# Patient Record
Sex: Female | Born: 1935 | Race: White | Hispanic: No | Smoking: Former smoker
Health system: Southern US, Community
[De-identification: ages and names within clinical notes are randomized; demographics above are authoritative.]

## PROBLEM LIST (undated history)

## (undated) DIAGNOSIS — I4891 Unspecified atrial fibrillation: Secondary | ICD-10-CM

## (undated) DIAGNOSIS — K219 Gastro-esophageal reflux disease without esophagitis: Secondary | ICD-10-CM

## (undated) DIAGNOSIS — N83202 Unspecified ovarian cyst, left side: Secondary | ICD-10-CM

## (undated) DIAGNOSIS — N83201 Unspecified ovarian cyst, right side: Secondary | ICD-10-CM

## (undated) DIAGNOSIS — F419 Anxiety disorder, unspecified: Secondary | ICD-10-CM

## (undated) DIAGNOSIS — R413 Other amnesia: Secondary | ICD-10-CM

## (undated) DIAGNOSIS — I10 Essential (primary) hypertension: Secondary | ICD-10-CM

## (undated) HISTORY — DX: Gastro-esophageal reflux disease without esophagitis: K21.9

## (undated) HISTORY — PX: ESOPHAGOGASTRODUODENOSCOPY: SHX1529

## (undated) HISTORY — DX: Unspecified atrial fibrillation: I48.91

## (undated) HISTORY — PX: OVARIAN CYST SURGERY: SHX726

## (undated) HISTORY — DX: Anxiety disorder, unspecified: F41.9

## (undated) HISTORY — PX: COLONOSCOPY: SHX5424

## (undated) HISTORY — PX: KNEE SURGERY: SHX244

## (undated) HISTORY — PX: EYE SURGERY: SHX253

---

## 1997-10-04 ENCOUNTER — Emergency Department (HOSPITAL_COMMUNITY): Admission: EM | Admit: 1997-10-04 | Discharge: 1997-10-04 | Payer: Self-pay | Admitting: Emergency Medicine

## 1998-01-14 ENCOUNTER — Ambulatory Visit (HOSPITAL_COMMUNITY): Admission: RE | Admit: 1998-01-14 | Discharge: 1998-01-14 | Payer: Self-pay | Admitting: *Deleted

## 1998-04-15 ENCOUNTER — Emergency Department (HOSPITAL_COMMUNITY): Admission: EM | Admit: 1998-04-15 | Discharge: 1998-04-15 | Payer: Self-pay | Admitting: Emergency Medicine

## 1998-08-15 ENCOUNTER — Ambulatory Visit (HOSPITAL_COMMUNITY): Admission: RE | Admit: 1998-08-15 | Discharge: 1998-08-15 | Payer: Self-pay | Admitting: *Deleted

## 1999-01-10 ENCOUNTER — Ambulatory Visit (HOSPITAL_COMMUNITY): Admission: RE | Admit: 1999-01-10 | Discharge: 1999-01-10 | Payer: Self-pay | Admitting: *Deleted

## 1999-01-10 ENCOUNTER — Encounter (INDEPENDENT_AMBULATORY_CARE_PROVIDER_SITE_OTHER): Payer: Self-pay | Admitting: Specialist

## 1999-09-09 ENCOUNTER — Other Ambulatory Visit: Admission: RE | Admit: 1999-09-09 | Discharge: 1999-09-09 | Payer: Self-pay | Admitting: *Deleted

## 2000-05-07 ENCOUNTER — Ambulatory Visit (HOSPITAL_COMMUNITY): Admission: RE | Admit: 2000-05-07 | Discharge: 2000-05-07 | Payer: Self-pay | Admitting: *Deleted

## 2000-12-10 ENCOUNTER — Ambulatory Visit (HOSPITAL_COMMUNITY): Admission: RE | Admit: 2000-12-10 | Discharge: 2000-12-10 | Payer: Self-pay | Admitting: *Deleted

## 2001-05-31 ENCOUNTER — Encounter: Admission: RE | Admit: 2001-05-31 | Discharge: 2001-05-31 | Payer: Self-pay | Admitting: *Deleted

## 2001-06-24 ENCOUNTER — Encounter (INDEPENDENT_AMBULATORY_CARE_PROVIDER_SITE_OTHER): Payer: Self-pay | Admitting: Specialist

## 2001-06-24 ENCOUNTER — Ambulatory Visit (HOSPITAL_COMMUNITY): Admission: RE | Admit: 2001-06-24 | Discharge: 2001-06-24 | Payer: Self-pay | Admitting: *Deleted

## 2001-09-25 ENCOUNTER — Emergency Department (HOSPITAL_COMMUNITY): Admission: EM | Admit: 2001-09-25 | Discharge: 2001-09-25 | Payer: Self-pay | Admitting: Emergency Medicine

## 2002-08-11 ENCOUNTER — Encounter: Admission: RE | Admit: 2002-08-11 | Discharge: 2002-08-11 | Payer: Self-pay | Admitting: Internal Medicine

## 2002-08-11 ENCOUNTER — Encounter: Payer: Self-pay | Admitting: Internal Medicine

## 2003-04-25 ENCOUNTER — Emergency Department (HOSPITAL_COMMUNITY): Admission: EM | Admit: 2003-04-25 | Discharge: 2003-04-25 | Payer: Self-pay | Admitting: *Deleted

## 2003-08-03 ENCOUNTER — Ambulatory Visit (HOSPITAL_COMMUNITY): Admission: RE | Admit: 2003-08-03 | Discharge: 2003-08-03 | Payer: Self-pay | Admitting: *Deleted

## 2003-08-03 ENCOUNTER — Encounter (INDEPENDENT_AMBULATORY_CARE_PROVIDER_SITE_OTHER): Payer: Self-pay | Admitting: *Deleted

## 2004-01-25 ENCOUNTER — Encounter: Admission: RE | Admit: 2004-01-25 | Discharge: 2004-01-25 | Payer: Self-pay | Admitting: Internal Medicine

## 2004-02-08 ENCOUNTER — Ambulatory Visit (HOSPITAL_COMMUNITY): Admission: RE | Admit: 2004-02-08 | Discharge: 2004-02-08 | Payer: Self-pay | Admitting: Internal Medicine

## 2005-03-20 ENCOUNTER — Other Ambulatory Visit: Admission: RE | Admit: 2005-03-20 | Discharge: 2005-03-20 | Payer: Self-pay | Admitting: Internal Medicine

## 2005-11-17 ENCOUNTER — Encounter: Admission: RE | Admit: 2005-11-17 | Discharge: 2005-11-17 | Payer: Self-pay | Admitting: Internal Medicine

## 2005-12-17 ENCOUNTER — Emergency Department (HOSPITAL_COMMUNITY): Admission: EM | Admit: 2005-12-17 | Discharge: 2005-12-18 | Payer: Self-pay | Admitting: Emergency Medicine

## 2006-09-24 ENCOUNTER — Encounter (INDEPENDENT_AMBULATORY_CARE_PROVIDER_SITE_OTHER): Payer: Self-pay | Admitting: *Deleted

## 2006-09-24 ENCOUNTER — Ambulatory Visit (HOSPITAL_COMMUNITY): Admission: RE | Admit: 2006-09-24 | Discharge: 2006-09-24 | Payer: Self-pay | Admitting: *Deleted

## 2007-10-25 ENCOUNTER — Encounter: Admission: RE | Admit: 2007-10-25 | Discharge: 2007-10-25 | Payer: Self-pay | Admitting: Internal Medicine

## 2007-12-10 ENCOUNTER — Emergency Department (HOSPITAL_COMMUNITY): Admission: EM | Admit: 2007-12-10 | Discharge: 2007-12-10 | Payer: Self-pay | Admitting: Emergency Medicine

## 2008-04-23 ENCOUNTER — Inpatient Hospital Stay (HOSPITAL_COMMUNITY): Admission: RE | Admit: 2008-04-23 | Discharge: 2008-04-26 | Payer: Self-pay | Admitting: Orthopedic Surgery

## 2008-12-13 ENCOUNTER — Encounter: Admission: RE | Admit: 2008-12-13 | Discharge: 2008-12-13 | Payer: Self-pay | Admitting: Internal Medicine

## 2009-02-02 ENCOUNTER — Emergency Department (HOSPITAL_COMMUNITY): Admission: EM | Admit: 2009-02-02 | Discharge: 2009-02-02 | Payer: Self-pay | Admitting: Family Medicine

## 2009-05-20 ENCOUNTER — Encounter: Admission: RE | Admit: 2009-05-20 | Discharge: 2009-05-20 | Payer: Self-pay | Admitting: Internal Medicine

## 2010-06-18 ENCOUNTER — Other Ambulatory Visit: Payer: Self-pay | Admitting: Internal Medicine

## 2010-06-18 ENCOUNTER — Ambulatory Visit
Admission: RE | Admit: 2010-06-18 | Discharge: 2010-06-18 | Disposition: A | Discharge status: Home / Self Care | Payer: Medicare Other | Source: Ambulatory Visit | Attending: Internal Medicine | Admitting: Internal Medicine

## 2010-06-18 DIAGNOSIS — R109 Unspecified abdominal pain: Secondary | ICD-10-CM

## 2010-07-30 ENCOUNTER — Other Ambulatory Visit: Payer: Self-pay | Admitting: Family Medicine

## 2010-07-30 DIAGNOSIS — R1011 Right upper quadrant pain: Secondary | ICD-10-CM

## 2010-07-30 DIAGNOSIS — M549 Dorsalgia, unspecified: Secondary | ICD-10-CM

## 2010-07-31 ENCOUNTER — Other Ambulatory Visit: Payer: Medicare Other

## 2010-08-01 ENCOUNTER — Other Ambulatory Visit: Payer: Self-pay | Admitting: Family Medicine

## 2010-08-01 ENCOUNTER — Ambulatory Visit
Admission: RE | Admit: 2010-08-01 | Discharge: 2010-08-01 | Disposition: A | Discharge status: Home / Self Care | Payer: Medicare Other | Source: Ambulatory Visit | Attending: Family Medicine | Admitting: Family Medicine

## 2010-08-01 DIAGNOSIS — M549 Dorsalgia, unspecified: Secondary | ICD-10-CM

## 2010-08-01 DIAGNOSIS — R1011 Right upper quadrant pain: Secondary | ICD-10-CM

## 2010-08-04 LAB — URINE CULTURE
Colony Count: NO GROWTH
Culture: NO GROWTH
Special Requests: POSITIVE

## 2010-08-04 LAB — CBC
Hemoglobin: 9.3 g/dL — ABNORMAL LOW (ref 12.0–15.0)
MCHC: 35.6 g/dL (ref 30.0–36.0)
MCV: 96.3 fL (ref 78.0–100.0)
Platelets: 153 10*3/uL (ref 150–400)
RBC: 2.8 MIL/uL — ABNORMAL LOW (ref 3.87–5.11)
RBC: 2.83 MIL/uL — ABNORMAL LOW (ref 3.87–5.11)
RBC: 3.51 MIL/uL — ABNORMAL LOW (ref 3.87–5.11)
RDW: 12.2 % (ref 11.5–15.5)
WBC: 9.5 10*3/uL (ref 4.0–10.5)

## 2010-08-04 LAB — BASIC METABOLIC PANEL
BUN: 21 mg/dL (ref 6–23)
BUN: 25 mg/dL — ABNORMAL HIGH (ref 6–23)
CO2: 24 mEq/L (ref 19–32)
CO2: 25 mEq/L (ref 19–32)
Calcium: 7.1 mg/dL — ABNORMAL LOW (ref 8.4–10.5)
Calcium: 7.3 mg/dL — ABNORMAL LOW (ref 8.4–10.5)
Calcium: 7.3 mg/dL — ABNORMAL LOW (ref 8.4–10.5)
Calcium: 7.7 mg/dL — ABNORMAL LOW (ref 8.4–10.5)
Calcium: 8.1 mg/dL — ABNORMAL LOW (ref 8.4–10.5)
Chloride: 96 mEq/L (ref 96–112)
Creatinine, Ser: 1.34 mg/dL — ABNORMAL HIGH (ref 0.4–1.2)
Creatinine, Ser: 1.6 mg/dL — ABNORMAL HIGH (ref 0.4–1.2)
Creatinine, Ser: 1.71 mg/dL — ABNORMAL HIGH (ref 0.4–1.2)
GFR calc Af Amer: 25 mL/min — ABNORMAL LOW (ref >60)
GFR calc Af Amer: 26 mL/min — ABNORMAL LOW (ref >60)
GFR calc Af Amer: 35 mL/min — ABNORMAL LOW (ref >60)
GFR calc Af Amer: 38 mL/min — ABNORMAL LOW (ref >60)
GFR calc Af Amer: 46 mL/min — ABNORMAL LOW (ref >60)
GFR calc non Af Amer: 20 mL/min — ABNORMAL LOW (ref >60)
GFR calc non Af Amer: 22 mL/min — ABNORMAL LOW (ref >60)
GFR calc non Af Amer: 31 mL/min — ABNORMAL LOW (ref >60)
Sodium: 132 mEq/L — ABNORMAL LOW (ref 135–145)
Sodium: 136 mEq/L (ref 135–145)

## 2010-08-04 LAB — PROTIME-INR
INR: 1.2 (ref 0.00–1.49)
INR: 1.8 — ABNORMAL HIGH (ref 0.00–1.49)
INR: 2.8 — ABNORMAL HIGH (ref 0.00–1.49)
Prothrombin Time: 15.5 seconds — ABNORMAL HIGH (ref 11.6–15.2)
Prothrombin Time: 21.9 seconds — ABNORMAL HIGH (ref 11.6–15.2)
Prothrombin Time: 31.6 seconds — ABNORMAL HIGH (ref 11.6–15.2)

## 2010-08-04 LAB — GLUCOSE, CAPILLARY
Glucose-Capillary: 122 mg/dL — ABNORMAL HIGH (ref 70–99)
Glucose-Capillary: 127 mg/dL — ABNORMAL HIGH (ref 70–99)
Glucose-Capillary: 137 mg/dL — ABNORMAL HIGH (ref 70–99)

## 2010-08-04 LAB — URINALYSIS, ROUTINE W REFLEX MICROSCOPIC
Bilirubin Urine: NEGATIVE
Glucose, UA: NEGATIVE mg/dL
Ketones, ur: NEGATIVE mg/dL
Leukocytes, UA: NEGATIVE
Protein, ur: NEGATIVE mg/dL

## 2010-08-04 LAB — TYPE AND SCREEN

## 2010-08-04 LAB — ABO/RH: ABO/RH(D): A POS

## 2010-08-04 LAB — URINE MICROSCOPIC-ADD ON

## 2010-09-02 NOTE — Op Note (Signed)
NAMEPELLA, ORIGER                 ACCOUNT NO.:  [id number]   MEDICAL RECORD NO.:  [id number]          PATIENT TYPE:  AMB   LOCATION:  ENDO                         FACILITY:  [facility]   PHYSICIAN:  Waverly Ferrari, M.D.    DATE OF BIRTH:  1928/09/28   DATE OF PROCEDURE:  09/24/2006  DATE OF DISCHARGE:                               OPERATIVE REPORT   PROCEDURE:  Upper endoscopy.   INDICATIONS:  GERD.   ANESTHESIA:  Demerol 80 and Versed 8 mg.   PROCEDURE:  With the patient mildly sedated in the left lateral  decubitus position, the Pentax videoscopic endoscope was inserted in the  mouth, passed under direct vision through the esophagus when we saw  changes of Barrett's esophagus.  There was a polyp at the proximal end  of the gastric fold near the squamocolumnar junction.  This was  photographed and multiple biopsies taken of this and the surrounding  areas of Barrett's esophagus.  We entered into the stomach.  Fundus,  body and antrum were visualized.  In the body of the stomach were a  number of polyps.  They were also in the fundus.  They were photographed  and multiple biopsies taken.  Duodenal bulb and second portion of the  duodenum appeared normal.  From this point the endoscope was slowly  withdrawn taking circumferential views of duodenal mucosa until the  endoscope had been pulled back into the stomach, placed in retroflexion  to view the stomach from below.  The endoscope was then straightened and  withdrawn taking circumferential views of remaining gastric and  esophageal mucosa.  The patient's vital signs and pulse oximeter  remained stable.  The patient tolerated the procedure well without  apparent complications.   FINDINGS:  Changes of Barrett's esophagus with polyp of the proximal  stomach biopsied.  Polyps of the stomach as well.  Await biopsy report.  The patient will call me for results and follow-up with me as an  outpatient.  Proceed to colonoscopy as  planned.           ______________________________  Waverly Ferrari, M.D.     GMO/MEDQ  D:  09/24/2006  T:  09/24/2006  Job:  [id number]

## 2010-09-02 NOTE — Op Note (Signed)
Christine Conley, Christine Conley                 ACCOUNT NO.:  [id number]   MEDICAL RECORD NO.:  [id number]          PATIENT TYPE:  INP   LOCATION:  1620                         FACILITY:  [facility]   PHYSICIAN:  Gaynelle Arabian, M.D.    DATE OF BIRTH:  [birth year]   DATE OF PROCEDURE:  04/23/2008  DATE OF DISCHARGE:                               OPERATIVE REPORT   PREOPERATIVE DIAGNOSIS:  Osteoarthritis left knee.   POSTOPERATIVE DIAGNOSIS:  Osteoarthritis left knee.   PROCEDURE:  Left total knee arthroplasty.   SURGEON:  Gaynelle Arabian, M.D.   ASSISTANT:  Evert Kohl, P.A.-C.   ANESTHESIA:  Spinal.   ESTIMATED BLOOD LOSS:  Minimal.   DRAINS:  None.   TOURNIQUET TIME:  36 minutes at 300 mmHg.   COMPLICATIONS:  None.   CONDITION:  Stable to recovery room.   BRIEF CLINICAL NOTE:  Christine Conley is a 77 year old female with end-stage  arthritis both knees, left more symptomatic than the right.  She has  failed nonoperative management and presents now for total knee  arthroplasty.   PROCEDURE IN DETAIL:  After successful administration of spinal  anesthetic, a tourniquet is placed high on her left thigh and left lower  extremity prepped and draped in the usual sterile fashion.  Extremity  was wrapped in Esmarch, knee flexed, tourniquet inflated to 300 mmHg.  Midline incision was made with a 10 blade through subcutaneous tissue to  the level of the extensor mechanism.  A fresh blade was used to make a  medial parapatellar arthrotomy.  Soft tissue over the proximal medial  tibia subperiosteally elevated to the joint line with the knife and into  the semimembranosus bursa with a Cobb elevator.  Soft tissue laterally  is elevated with attention being paid to avoid patellar tendon on tibial  tubercle.  The patella is subluxed laterally, knee flexed 90 degrees and  ACL and PCL removed.  Drill was used to create a starting hole in the  distal femur.  The canal was thoroughly irrigated.  The 5  degrees left  valgus alignment guide is placed and referencing off posterior condyles,  rotations marked and a block pinned to remove 11 mm off the distal  femur.  11 mm were resected due to flexion contracture.  Distal femoral  resection is made with an oscillating saw.  The sizing blocks placed,  size 3 is most appropriate.  Rotations marked off the epicondylar axis.  Size 3 cutting block is placed and the anterior, posterior chamfer cuts  were made.   The tibia subluxed forward and the menisci are removed.  The  extramedullary tibial alignment guide is placed referencing proximally  off the medial aspect of the tibial tubercle and distally along the  second metatarsal axis and tibial crest.  Blocks pinned to remove about  10 mm off the non deficient lateral side.  Tibial resection is made with  an oscillating saw.  Size 3 is the most appropriate tibial component and  the proximal tibia is prepared with a modular drill and keel punch for  the size 3.  Femoral preparation is completed with the intercondylar  cut.   Size 3 mobile bearing tibial trial, size 3 posterior stabilized femoral  trial and a 10 mm posterior stabilized rotating platform insert trial  are placed.  With a 10, full extension was achieved with excellent varus-  valgus, anterior-posterior balance throughout full range of motion.  Patella was then everted and thickness measured to be 23 mm.  Freehand  resection taken to 13 mm, 35 template is placed, lug holes were drilled,  trial patella was placed and it tracks normally.  Osteophytes removed  off the posterior femur with the trial in place.  All trials removed and  the cut bone surfaces are prepared with pulsatile lavage.  Cement was  mixed and once ready for implantation, a size 3 mobile bearing tibial  tray, size 3 posterior stabilized femur and 35 patella are cemented into  place.  The patella was held with a clamp.  Trial 10-mm insert is  placed, knee held in  full extension and all extruded cement removed.  Once cement fully hardened then the permanent 10 mm posterior stabilized  rotating platform insert is placed into the tibial tray.  Wounds  copiously irrigated with saline solution and the FloSeal injected on the  posterior capsule, medial and lateral gutters and suprapatellar area.  Moist sponge is placed and the tourniquet released for a total  tourniquet time of 36 minutes.  The sponge is held for 2 minutes, then  removed.  Minimal bleeding was encountered.  Bleeding that is  encountered is stopped with electrocautery.  The wound was again  irrigated and the arthrotomy closed with interrupted #1 PDS.  Flexion  against gravity about 135 degrees.  Subcu was closed with interrupted 2-  0 Vicryl and subcuticular closed with running 4-0 Monocryl.  The  incision is then cleaned and dried and Steri-Strips and a bulky sterile  dressing are applied.  She is then placed into a knee immobilizer,  awakened and transferred to recovery in stable condition.      Gaynelle Arabian, M.D.  Electronically Signed     FA/MEDQ  D:  04/23/2008  T:  04/23/2008  Job:  [id number]

## 2010-09-02 NOTE — Op Note (Signed)
[name], Christine Conley                 ACCOUNT NO.:  [id number]   MEDICAL RECORD NO.:  [id number]          PATIENT TYPE:  AMB   LOCATION:  ENDO                         FACILITY:  [facility]   PHYSICIAN:  Waverly Ferrari, M.D.    DATE OF BIRTH:  12-Dec-1928   DATE OF PROCEDURE:  09/24/2006  DATE OF DISCHARGE:                               OPERATIVE REPORT   PROCEDURE:  Colonoscopy.   INDICATIONS:  Colon polyps, colon cancer screening.   ANESTHESIA:  Demerol 40 mg, Versed 4 mg.   PROCEDURE:  With the patient mildly sedated in the left lateral  decubitus position the Pentax videoscopic colonoscope was inserted the  rectum, passed through a diverticular filled colon to reach the cecum as  identified by ileocecal valve and appendiceal orifice both which were  photographed.  From this point the colonoscope was slowly withdrawn  taking circumferential views of colonic mucosa stopping in the  descending colon were two polyps were seen, one was photographed but  both were removed using hot biopsy forceps technique setting of 20/200  blended current.  We withdrew all the way to the rectum which appeared  normal on direct and showed hemorrhoids on retroflexed view.  The  endoscope was straightened, withdrawn.  The patient's vital signs, pulse  oximeter remained stable.  The patient tolerated procedure well without  apparent complications.   FINDINGS:  Diffuse diverticulosis of the colon, polyps of descending  colon.  Internal hemorrhoids.   PLAN:  Await biopsy report.  The patient will call me for results and  follow-up with me as an outpatient.           ______________________________  Waverly Ferrari, M.D.     GMO/MEDQ  D:  09/24/2006  T:  09/24/2006  Job:  [id number]

## 2010-09-02 NOTE — Discharge Summary (Signed)
NAMECHARLIANN, Christine Conley                 ACCOUNT NO.:  [id number]   MEDICAL RECORD NO.:  [id number]          PATIENT TYPE:  INP   LOCATION:  1620                         FACILITY:  [facility]   PHYSICIAN:  Gaynelle Arabian, M.D.    DATE OF BIRTH:  09-Apr-1929   DATE OF ADMISSION:  04/23/2008  DATE OF DISCHARGE:  04/26/2008                               DISCHARGE SUMMARY   ADMITTING DIAGNOSES:  1. End-stage rheumatoid arthritis, bilateral knees.  2. Hypertension.  3. Diet-controlled diabetes.  4. Gastroesophageal reflux disease.  5. History of Barrett's esophagus.  6. Sleep apnea.  7. History of irritable bowel syndrome.  8. Anxiety.  9. Cataracts.  10.History of shingles.  11.History of gallstones.  12.History of diverticulosis.  13.Osteopenia.   DISCHARGE DIAGNOSES:  1. Osteoarthritis, left knee status post left total knee replacement      arthroplasty.  2. Osteoarthritis, right knee.  3. Postop renal insufficiency, improving.  4. Postop hyponatremia, improved.  5. Mild postop acute blood loss anemia.  Did not require transfusion.  6. End-stage rheumatoid arthritis, bilateral knees.  7. Hypertension.  8. Diet-controlled diabetes.  9. Gastroesophageal reflux disease.  10.History of Barrett's esophagus.  11.Sleep apnea.  12.History of irritable bowel syndrome.  13.Anxiety.  14.Cataracts.  15.History of shingles.  16.History of gallstones.  17.History of diverticulosis.  18.Osteopenia.   PROCEDURE:  April 23, 2008, left total knee surgery.   SURGEON:  Gaynelle Arabian, M.D.   ASSISTANT:  Rutherford Limerick PA-C.   ANESTHESIA:  Spinal.   TOURNIQUET TIME:  36 minutes.   CONSULTS:  None.   BRIEF HISTORY:  Ms. Mozo is a 77 year old female known by Dr. Wynelle Link  for bilateral knee arthritis, progressively getting worse, bone on bone,  left is worse than the right.  She has failed conservative treatment and  now presents for total knee arthroplasty.   LABORATORY DATA:  Preop CBC  showed hemoglobin of 14.2, hematocrit of  41.3, white cell count 6.1, platelets 217.  PT/PTT 14 and 29,  respectively.  INR 1.1.  Chem panel on admission slightly showed  elevated glucose of 126, known diet controlled diabetic.  Remaining Chem  panel within normal limits.  Preop UA showed trace blood, 0-2 whites, 0-  2 reds, otherwise negative.  Serial CBCs were followed throughout the  hospital course.  Hemoglobin did drop down to 11.5, then 9.3, came back  up a little bit and stabilized at  9.7 and 27.3.  Serial protimes  followed per Coumadin protocol.  Last known PT/INR 31.6 and 2.8.  Serial  BMETs were followed.  Sodium did drop from 143 to 132, got as low as  131, came back up to 137.  Her starting BUN and creatinine was normal at  16 and 0.9, however, she did develop some mild renal insufficiency and  her BUN went up to 26, but back down to 21.  The creatinine went from  0.9 to 1.6, got as high as 2.3, drifted back down to 2.2, then 1.7 and  was improving and last noted was 1.3.  Follow-up UA on April 26, 2008,  small blood, 0-2 whites, 0-2 reds, otherwise negative.  The culture was  pending at that time of dictation.   X-RAYS:  Two-view chest April 16, 2008, no active lung disease,  probable small hiatal hernia.   HOSPITAL COURSE:  The patient admitted to [facility],  tolerated total protein well, later transferred to the recovery room and  then orthopedic floor, started on PCA and p.o. analgesic pain control  following surgery.  Wanted to go to a skilled nursing facility postop so  we got social worker involved immediately.  Started back on her home  medications.  Pressure was a little soft and systolic was only about  [id number].  She was on an ACE inhibitor and she was under spinal so the urine  output was a little low.  We held her blood pressure medications,  especially the ACE inhibitor.  We did give her fluids and her output was  low to begin with and only slowly  improved.  Hemoglobin was stable  though her creatinine had bumped just a little bit on day #1 up to 1.6,  associated probably with the mild soft pressures and the spinal, also  being on an ACE inhibitor preoperatively.  By day #2, her output had  started to slowly increase but the creatinine had bumped up to 2.3.  We  did recheck and it was stable at 2.2.  She had continued with fluids but  by the afternoon of day #2, her output had increased and she was doing  much better with that.  Her creatinine started to improve and it got  down to 1.7; although with all the fluids, she did get some dilutional  component her sodium was down to 131.  Once her output was good and her  pressure was improving we stopped the fluids.  The renal insufficiency  continued to improve and by the following day, postop day #3, it was  down to 1.3.  Her sodium was back up to 137.  From a therapy standpoint,  she got out of bed on day #1 and was walking in the hallways a short  distance.  Dressing was changed on day #2 and incision was healing well.  By day #3, she was progressing with her therapy.  Medically, she was  stable.  Pressure was stable in the [id number] systolic.  Her sodium had  corrected itself to normal level and the creatinine was improving.  Discharge planning had been working with her and they found a bed over  at [facility].  Since she was stable we decided to transfer her over  at that time.   DISCHARGE/PLAN:  1. The patient transferred over to [facility] on April 26, 2008.  2. Discharge diagnoses please see above.   DISCHARGE MEDICATIONS:  Current medications at time of transfer include  1. Atenolol 25 mg daily p.m.  2. Enalapril/hydrochlorothiazide 10/25 p.o. q.p.m., may resume this      tomorrow on April 27, 2008.  3. Coumadin protocol.  Please titrate the Coumadin level for target      INR between 2 and 3, she needs to be on Coumadin for 3 weeks of      date of surgery of April 23, 2008, please see the bottom of the      summary for the Coumadin regimen during the hospital course.  4. Colace 100 mg p.o. b.i.d.  5. Nexium 40 mg p.o. q.h.s.  6. Nu-Iron 150 mg p.o. daily per 3 weeks, may discontinue  3 weeks from      the date of surgery.  7. Tylenol 325 one or two every 4-6 hours as needed for mild pain,      temperature or headache,  8. Robaxin 500 mg p.o. q.6-8 h p.r.n. spasm.  9. Percocet 5 mg 1-2 every 4 hours as needed for pain.  10.Valium 5 mg p.o. q.12 h as needed for spasm or agitation.  11.Ultram 50 mg p.o. q.4 h p.r.n. mild pain.   DIET:  Low-sodium, heart-healthy diet, low-cholesterol diet.   ACTIVITY:  She is weightbearing as tolerated to the left lower  extremity.  Continue gait training, ambulation, ADLs, PT and OT for  total knee protocol.  She may start showering, however, do not submerge  the incision under water.   FOLLOW UP:  She needs to follow up with Dr. Wynelle Link in the office at the  [facility] on Tuesday,  May 08, 2008, or Thursday, May 10, 2008.  Please contact the  office at [phone] to arrange appointment time and transfer of the  patient for follow-up care.   DISPOSITION:  [facility]   CONDITION ON DISCHARGE:  Improving.   Coumadin regimen during the hospital course is as follows.  Her preop  INR was 1.1.  on postop day #1, she got 5 mg tablet and the INR was 1.2.  On postop day #2, she received a 5 mg tablet and INR came up to 1.8.  On  postop day #3, she received a 2.5 mg tablet and the INR had come up even  further to 2.8 at the time of discharge.      Alexzandrew L. Perkins, P.A.C.      Gaynelle Arabian, M.D.  Electronically Signed    ALP/MEDQ  D:  04/26/2008  T:  04/26/2008  Job:  [id number]   cc:   Gaynelle Arabian, M.D.  Fax: [phone]   Merrilee Seashore, M.D.  Fax: [place]

## 2010-09-05 NOTE — Procedures (Signed)
[facility]  Patient:    DARNELLE, DEBONA                        MRN: [id number] Proc. Date: 05/07/00 Adm. Date:  [id number] Disc. Date: [id number] Attending:  Jim Desanctis                           Procedure Report  PROCEDURE:  Upper endoscopy with biopsy.  INDICATIONS:  Barretts esophagus.  ANESTHESIA:  Demerol 70 mg and Versed 9 mg.  PROCEDURE:  With the patient mildly sedated in the left lateral decubitus position, the Olympus videoscopic endoscope was inserted in the mouth and was passed under direct vision through the esophagus, which appeared normal until we reached the distal esophagus where there were changes of possible Barretts esophagus.  It was photographed and biopsied.  We entered into the stomach.  A hiatal hernia sac was seen.  The fundus, body, antrum, duodenal bulb, and second portion of the duodenum all appeared normal. Photographs were taken from this point.  The endoscope was slowly withdrawn, taking circumferential views of the entire duodenal mucosa to the endoscope and pulled back, and the stomach was placed in retroflexion, viewing the stomach from below, and this appeared normal other than a hiatal hernia.  The endoscope was straightened and withdrawn, taking circumferential views of the entire gastric and esophageal mucosa, which otherwise appeared normal.  The patients vital signs and pulse oximeter remained stable.  The patient tolerated the procedure well without apparent complications.  FINDINGS:  Barretts esophagus above a hiatal hernia.  PLAN:  Will await biopsy report.  The patient will call me for results and follow up with me as an outpatient. DD:  05/07/00 TD:  05/09/00 Job: [id number]

## 2010-09-05 NOTE — H&P (Signed)
Christine Conley, Christine Conley                 ACCOUNT NO.:  [id number]   MEDICAL RECORD NO.:  [id number]          PATIENT TYPE:  INP   LOCATION:  NA                           FACILITY:  [facility]   PHYSICIAN:  Gaynelle Arabian, M.D.    DATE OF BIRTH:  03-May-1928   DATE OF ADMISSION:  04/23/2008  DATE OF DISCHARGE:                              HISTORY & PHYSICAL   CHIEF COMPLAINT:  Bilateral knee pain.   HISTORY OF PRESENT ILLNESS:  The patient is a 77 year old female patient  of Dr. Aluisio's for evaluation of bilateral knee pain.  She has been  having pain with ambulation, pain with range of motion.  She has noted  loss of range of motion.  Evaluation shows that she has end-stage  osteoarthritis bilateral knees, bone on bone, medical compartment.  The  patient currently states that her left knee is worse than her right.  She has failed conservative treatment.  The patient has elected to  proceed with a total knee arthroplasty on the left.   ALLERGIES:  ASPIRIN causing palpitations, PENICILLIN causing swelling,  RANTAPAON an old pain medicine, ETHER, hallucinations with CODEINE.   CURRENT MEDICATIONS:  1. Atenolol 25 mg once a day.  2. Enalapril/hydrochlorothiazide 10/25 mg once a day.  3. Nexium 40 mg a day.  4. Tramidol 50 mg one or two tablets every 6 hours p.r.n.  5. Diazepam 5 mg p.r.n.   PRIMARY CARE PHYSICIAN:  Merrilee Seashore, M.D.   PAST MEDICAL HISTORY:  1. Hypertension.  2. Reflux.  3. History of Barrett's esophagitis.  4. History of irritable bowel syndrome.  5. History of diverticulosis.  6. History of hypercholesterolemia.  7. Sleep apnea but she does not use CPAP.  8. Anxiety.  9. History of shingles.  10.History of cataracts.  11.History of gallstones.  12.Diet-controlled diabetes.  13.Osteopenia.   REVIEW OF SYSTEMS:  Positive for well-controlled anxiety.  She has had  shingles in her lower lateral abdominal region.  PULMONARY:  She does  have sleep apnea.  She  does not have difficulty sleeping. She does not  use the CPAP machine due to discomfort with the machine.  CARDIOVASCULAR:  Her last stress test was two years previous.  Her  hypertension is well controlled.  No previous cardiac complaints.  GASTROINTESTINAL:  She does have history of Barrett's esophagitis, 11  years previous.  This is just monitored.  Her reflux is well controlled.  Her irritable bowel.  She does have urgency with bowel movements.  She  has had gallstones in the past.  She has not had surgery.  Diverticulosis was just found on a routine colonoscopy.  GU:  Is  unremarkable. ENDOCRINE:  She states at one point she was told she was  diabetic and then the next evaluation she was told she was not so she  just controls her glucose levels, which usually run around 130, with  diet.  HEMATOLOGIC:  Unremarkable.   PAST SURGICAL HISTORY:  Includes appendectomy, ovarian cyst removal, eye  surgery, cataract surgery without any anesthetic complications.   FAMILY MEDICAL HISTORY:  Father  is deceased with a history of asthmatic  heart.  Mother died at 59 with just gallstones.  Siblings: She does have  one with heart disease, cancer, kidney failure.   SOCIAL HISTORY:  Marital status:  Patient is widowed.  She has smoked in  the past, not presently.  She has three grown children.  She lives  alone.  She would like to go to a skilled nursing facility for  postoperative care.   PHYSICAL EXAMINATION:  VITAL SIGNS:  Height is 5 feet, 4 inches.  Weight  is 220 pounds.  Blood pressure is 114/68.  Pulse of 70, regular.  Respirations 12. The patient is afebrile.  GENERAL:  This is a healthy-appearing, slightly heavy-set female,  conscious, alert and appropriate. She ambulates with a cane.  HEENT:  Head was normocephalic.  Gross hearing is intact.  NECK:  Supple.  No palpable lymphadenopathy.  Good range of motion.  CHEST:  Lung sounds were clear and equal bilaterally.  HEART:  Regular rate  and rhythm.  ABDOMEN:  Soft, nontender.  Bowel sounds present.  EXTREMITIES:  Upper extremities have good range of motion with good  strength.  Lower extremities:  Both hips have full extension, flexion up  to 120 degrees with 30 degrees internal and external rotation without  any discomfort.  Bilateral knees were round, boggy appearing, no signs  of infection.  Both knees lacked about 5 degrees of extension.  She is  able to flex back to about 120 degrees.  No instability.  Calves are  soft.  NEUROLOGICAL:  Patient is conscious, alert and appropriate.  She had no  gross neurological defects noted.  PERIPHERAL VASCULAR:  Carotid pulses were 2+, no bruits.  Radial pulses  are 2+.  Dorsalis pedis pulses were 1+.  She had no lower extremity  edema at this time.  SKIN:  No significant dermatologic changes.  BREAST, RECTAL AND GU EXAM:  Deferred at this time.   IMPRESSION:  1. End-stage osteoarthritis bilateral knees.  2. Hypertension.  3. Diet-controlled borderline diabetes.  4. Gastroesophageal reflux disease.  5. History of Barrett's esophagitis.  6. History of sleep apnea.  7. History of irritable bowel syndrome.  8. History of anxiety.  9. History of cataracts.  10.History of shingles.  11.History of gallstones and diverticulosis.   PLAN:  The patient will undergo all routine labs and chest x-ray prior  to having a left total knee arthroplasty by Dr. Wynelle Link at [facility] on April 23, 2008.      Evert Kohl, P.A.      Gaynelle Arabian, M.D.  Electronically Signed    RWK/MEDQ  D:  04/05/2008  T:  04/05/2008  Job:  [id number]

## 2010-09-05 NOTE — Procedures (Signed)
[facility]  Patient:    KAEDEN, DUCE Visit Number: [id number] MRN: [id number]          Service Type: END Location: ENDO Attending Physician:  Jim Desanctis Dictated by:   Jim Desanctis, M.D. Proc. Date: 06/24/01 Admit Date:  06/24/2001                             Procedure Report  PROCEDURE:  Colonoscopy.  INDICATION:  Colon polyps.  ANESTHESIA:  Demerol 60, Versed 2 mg additionally.  DESCRIPTION OF PROCEDURE:  With the patient mildly sedated in the left lateral decubitus position, the Olympus videoscopic colonoscope was inserted into the rectum and passed to the cecum under direct vision, identified by the ileocecal valve and appendiceal orifice.  Diverticula were seen in the cecum. From this point, the colonoscope was slowly withdrawn, taking circumferential views of the entire colonic mucosa, stopping in the rectum which appeared normal on direct and showed hemorrhoids on retroflexed view.  The colonoscope was straightened and withdrawn.  The patients vital signs and pulse oximeter remained stable.  The patient tolerated the procedure well without apparent complications.  FINDINGS: 1. Diverticulosis throughout the colon, more so in the sigmoid. 2. Internal hemorrhoids. 3. Otherwise unremarkable exam.  PLAN:  Repeat examination in five years. Dictated by:   Jim Desanctis, M.D. Attending Physician:  Jim Desanctis DD:  06/24/01 TD:  06/24/01 Job: [id number]

## 2010-09-05 NOTE — Procedures (Signed)
[facility]  Patient:    Christine Conley, Christine Conley Visit Number: [id number] MRN: [id number]          Service Type: END Location: ENDO Attending Physician:  Jim Desanctis Dictated by:   Jim Desanctis, M.D. Proc. Date: 06/24/01 Admit Date:  06/24/2001                             Procedure Report  PROCEDURE:  Upper endoscopy.  INDICATIONS:  Barretts esophagus.  ANESTHESIA:  Demerol 60, Versed 8 mg.  DESCRIPTION OF PROCEDURE:  With patient mildly sedated in the left lateral decubitus position, the Olympus videoscopic endoscope was inserted into the mouth and passed under direct vision through the esophagus until we saw Barretts esophagus tissue, photographed and biopsied.  We entered into the stomach; fundus, body, antrum, duodenal bulb, and second portion of duodenum all of which appeared normal.  From this point, the endoscope was slowly withdrawn, taking circumferential views of the entire duodenal mucosa until the endoscope then pulled back into the stomach and placed in retroflexion to view the stomach from below.  The endoscope was then straightened and withdrawn, taking circumferential views of the entire gastric and esophageal mucosa.  The patients vital signs and pulse oximeter remained stable.  The patient tolerated the procedure well without apparent complications.  FINDINGS:  Barretts esophagus above a hiatal hernia, biopsied.  PLAN:  Await biopsy report.  The patient will call me for results and follow up with me as an outpatient.  Proceed to colonoscopy. Dictated by:   Jim Desanctis, M.D. Attending Physician:  Jim Desanctis DD:  06/24/01 TD:  06/24/01 Job: [id number]

## 2010-09-05 NOTE — Op Note (Signed)
NAMEARVETA, Christine Conley                           ACCOUNT NO.:  [id number]   MEDICAL RECORD NO.:  [id number]                   PATIENT TYPE:  AMB   LOCATION:  ENDO                                 FACILITY:  [facility]   PHYSICIAN:  Waverly Ferrari, M.D.                 DATE OF BIRTH:  [birth year]   DATE OF PROCEDURE:  08/03/2003  DATE OF DISCHARGE:                                 OPERATIVE REPORT   PROCEDURE:  Upper endoscopy with biopsy.   INDICATIONS FOR PROCEDURE:  Gastroesophageal reflux disease.   ANESTHESIA:  Demerol 75, Versed 8 mg.   DESCRIPTION OF PROCEDURE:  With the patient mildly sedated in the left  lateral decubitus position, the Olympus videoscopic endoscope was inserted  in the mouth and passed under direct vision through the esophagus which  appeared normal until we reached the distal esophagus.  Barrett's esophagus  was seen, photographed and biopsied.  We entered into the stomach. The  fundus, body, antrum, duodenal bulb and second portion of the duodenum were  visualized.  From this point, the endoscope was slowly withdrawn taking  circumferential views of the duodenal mucosa which appeared normal until we  reached the stomach and placed the scope in retroflexion to view the stomach  from below.  The endoscope was then straightened and withdrawn taking  circumferential views of the remaining gastric and esophageal mucosa  stopping to biopsy polyps and erythema seen along the way. The erythema was  in the antrum, the polyps were in the body and fundus of the stomach.  The  patient's vital signs and pulse oximeter remained stable. The patient  tolerated the procedure well without apparent complications.   FINDINGS:  Barrett's esophagus, erythema of antrum biopsied, gastric body  and fundic polyps. Await biopsy report. The patient will call me for results  and followup with me as an outpatient.                                               Waverly Ferrari, M.D.    GMO/MEDQ  D:  08/03/2003  T:  08/03/2003  Job:  [id number]

## 2010-09-05 NOTE — Procedures (Signed)
[facility]  Patient:    Christine Conley, Christine Conley                        MRN: [id number] Proc. Date: 05/07/00 Adm. Date:  [id number] Disc. Date: [id number] Attending:  Jim Desanctis                           Procedure Report  PROCEDURE:  Upper endoscopy with biopsy.  INDICATION FOR PROCEDURE:  Barretts esophagus.  ANESTHESIA:  Demerol 70 mg, Versed 9 mg.  DESCRIPTION OF PROCEDURE:  With the patient mildly sedated in the left lateral decubitus position, the Olympus videoscopic endoscope was inserted in the mouth and passed under direct vision through the esophagus which appeared normal until we reached the distal esophagus where there were changes of possible Barretts esophagus, photographed and biopsied. We entered in the stomach. A hiatal hernia sac was seen. The fundus, body, antrum, duodenal bulb and the second portion of the duodenum all appeared normal and photographs taken. From this, the endoscope was slowly withdrawn taking circumferential views of the entire duodenal mucosa until the endoscope was then pulled back into the stomach, placed in retroflexion to view the stomach from below and this appeared normal other than a hiatal hernia. The endoscope was straightened and withdrawn taking circumferential views of the entire gastric and esophageal mucosa which otherwise appeared normal.  The patients vital signs and pulse oximeter remained stable. The patient tolerated the procedure well and there were no apparent complications.  FINDINGS:  Barretts esophagus above a hiatal hernia. Await biopsy report. The patient will call me for results and follow-up with me as an outpatient. DD:  05/07/00 TD:  05/10/00 Job: [id number]

## 2011-01-23 LAB — URINE MICROSCOPIC-ADD ON

## 2011-01-23 LAB — COMPREHENSIVE METABOLIC PANEL
Alkaline Phosphatase: 53 U/L (ref 39–117)
BUN: 16 mg/dL (ref 6–23)
CO2: 30 mEq/L (ref 19–32)
Chloride: 102 mEq/L (ref 96–112)
Creatinine, Ser: 0.93 mg/dL (ref 0.4–1.2)
GFR calc non Af Amer: 58 mL/min — ABNORMAL LOW (ref >60)
Potassium: 4.3 mEq/L (ref 3.5–5.1)
Total Bilirubin: 0.9 mg/dL (ref 0.3–1.2)

## 2011-01-23 LAB — URINALYSIS, ROUTINE W REFLEX MICROSCOPIC
Bilirubin Urine: NEGATIVE
Glucose, UA: NEGATIVE mg/dL
Ketones, ur: NEGATIVE mg/dL
Protein, ur: NEGATIVE mg/dL
Urobilinogen, UA: 0.2 mg/dL (ref 0.0–1.0)

## 2011-01-23 LAB — CBC
HCT: 41.3 % (ref 36.0–46.0)
Hemoglobin: 14.2 g/dL (ref 12.0–15.0)
MCV: 96.4 fL (ref 78.0–100.0)
RBC: 4.28 MIL/uL (ref 3.87–5.11)
WBC: 6.1 10*3/uL (ref 4.0–10.5)

## 2011-01-23 LAB — PROTIME-INR: INR: 1.1 (ref 0.00–1.49)

## 2011-01-23 LAB — APTT: aPTT: 29 seconds (ref 24–37)

## 2011-10-28 ENCOUNTER — Other Ambulatory Visit: Payer: Self-pay | Admitting: Dermatology

## 2012-01-25 ENCOUNTER — Other Ambulatory Visit: Payer: Self-pay | Admitting: Dermatology

## 2016-07-25 LAB — GLUCOSE, POCT (MANUAL RESULT ENTRY): POC Glucose: 78 mg/dl (ref 70–99)

## 2016-07-25 LAB — POCT LIPID PANEL
HDL: 62
LDL: 128
TC: 206
TRG: 76

## 2016-09-23 ENCOUNTER — Emergency Department (HOSPITAL_COMMUNITY)
Admission: EM | Admit: 2016-09-23 | Discharge: 2016-09-23 | Disposition: A | Discharge status: Home / Self Care | Payer: Medicare Other | Attending: Emergency Medicine | Admitting: Emergency Medicine

## 2016-09-23 ENCOUNTER — Encounter (HOSPITAL_COMMUNITY): Payer: Self-pay | Admitting: Emergency Medicine

## 2016-09-23 ENCOUNTER — Emergency Department (HOSPITAL_COMMUNITY): Payer: Medicare Other

## 2016-09-23 DIAGNOSIS — I10 Essential (primary) hypertension: Secondary | ICD-10-CM | POA: Insufficient documentation

## 2016-09-23 DIAGNOSIS — Z79899 Other long term (current) drug therapy: Secondary | ICD-10-CM | POA: Diagnosis not present

## 2016-09-23 DIAGNOSIS — R1010 Upper abdominal pain, unspecified: Secondary | ICD-10-CM | POA: Insufficient documentation

## 2016-09-23 DIAGNOSIS — Z87891 Personal history of nicotine dependence: Secondary | ICD-10-CM | POA: Insufficient documentation

## 2016-09-23 DIAGNOSIS — R002 Palpitations: Secondary | ICD-10-CM | POA: Diagnosis present

## 2016-09-23 HISTORY — DX: Essential (primary) hypertension: I10

## 2016-09-23 LAB — BASIC METABOLIC PANEL
ANION GAP: 13 (ref 5–15)
BUN: 16 mg/dL (ref 6–20)
CALCIUM: 8.9 mg/dL (ref 8.9–10.3)
CO2: 25 mmol/L (ref 22–32)
Chloride: 101 mmol/L (ref 101–111)
Creatinine, Ser: 1.4 mg/dL — ABNORMAL HIGH (ref 0.44–1.00)
GFR calc non Af Amer: 33 mL/min — ABNORMAL LOW (ref >60)
GFR, EST AFRICAN AMERICAN: 38 mL/min — AB (ref >60)
GLUCOSE: 115 mg/dL — AB (ref 65–99)
POTASSIUM: 3.7 mmol/L (ref 3.5–5.1)
Sodium: 139 mmol/L (ref 135–145)

## 2016-09-23 LAB — CBC
HCT: 40 % (ref 36.0–46.0)
HEMOGLOBIN: 13.3 g/dL (ref 12.0–15.0)
MCH: 32.4 pg (ref 26.0–34.0)
MCHC: 33.3 g/dL (ref 30.0–36.0)
MCV: 97.3 fL (ref 78.0–100.0)
Platelets: 249 10*3/uL (ref 150–400)
RBC: 4.11 MIL/uL (ref 3.87–5.11)
RDW: 12.3 % (ref 11.5–15.5)
WBC: 7.8 10*3/uL (ref 4.0–10.5)

## 2016-09-23 LAB — I-STAT TROPONIN, ED
Troponin i, poc: 0 ng/mL (ref 0.00–0.08)
Troponin i, poc: 0.01 ng/mL (ref 0.00–0.08)

## 2016-09-23 LAB — TSH: TSH: 2.839 u[IU]/mL (ref 0.350–4.500)

## 2016-09-23 MED ORDER — IOPAMIDOL (ISOVUE-300) INJECTION 61%
INTRAVENOUS | Status: AC
  Filled 2016-09-23: qty 100

## 2016-09-23 MED ORDER — IOPAMIDOL (ISOVUE-300) INJECTION 61%
INTRAVENOUS | Status: AC
  Administered 2016-09-23: 75 mL
  Filled 2016-09-23: qty 75

## 2016-09-23 NOTE — ED Provider Notes (Signed)
[facility] DEPT Provider Note   CSN: [id number] Arrival date & time: 09/23/16  3267     History   Chief Complaint Chief Complaint  Patient presents with  . Palpitations    HPI Christine Conley is a 83 y.o. female.  Patient presents to the emergency para for evaluation of heart palpitations. Patient had an episode of palpitations prior to going to bed last night. She reports that it felt like her heart was racing for approximately an hour and then it stopped. She woke up around 5 AM with recurrent palpitations. She is not expressing any chest pain, shortness of breath. She does report intermittent episodes of similar symptoms that only lasted one or 2 minutes in the past, never lasting as long as it has today. Patient is currently asymptomatic.      Past Medical History:  Diagnosis Date  . Hypertension     There are no active problems to display for this patient.   Past Surgical History:  Procedure Laterality Date  . KNEE SURGERY    . OVARIAN CYST SURGERY      OB History    No data available       Home Medications    Prior to Admission medications   Medication Sig Start Date End Date Taking? Authorizing Provider  atenolol (TENORMIN) 25 MG tablet Take 25 mg by mouth daily. 08/10/16  Yes [provider]  pantoprazole (PROTONIX) 40 MG tablet Take 40 mg by mouth daily. 08/10/16  Yes [provider]  vitamin B-12 (CYANOCOBALAMIN) 250 MCG tablet Take 250 mcg by mouth daily.   Yes [provider]    Family History No family history on file.  Social History Social History  Substance Use Topics  . Smoking status: Former Research scientist (life sciences)  . Smokeless tobacco: Never Used  . Alcohol use No     Allergies   Aspirin; Codeine; Meloxicam; and Penicillins   Review of Systems Review of Systems  Cardiovascular: Positive for palpitations.  All other systems reviewed and are negative.    Physical Exam Updated Vital Signs BP (!) 116/49   Pulse 63    Temp 97.7 F (36.5 C) (Oral)   Resp 18   Ht 5\' 3"  (1.6 m)   Wt 78 kg (172 lb)   SpO2 99%   BMI 30.47 kg/m   Physical Exam  Constitutional: She is oriented to person, place, and time. She appears well-developed and well-nourished. No distress.  HENT:  Head: Normocephalic and atraumatic.  Right Ear: Hearing normal.  Left Ear: Hearing normal.  Nose: Nose normal.  Mouth/Throat: Oropharynx is clear and moist and mucous membranes are normal.  Eyes: Conjunctivae and EOM are normal. Pupils are equal, round, and reactive to light.  Neck: Normal range of motion. Neck supple.  Cardiovascular: Regular rhythm, S1 normal and S2 normal.  Exam reveals no gallop and no friction rub.   No murmur heard. Pulmonary/Chest: Effort normal and breath sounds normal. No respiratory distress. She exhibits no tenderness.  Abdominal: Soft. Normal appearance and bowel sounds are normal. There is no hepatosplenomegaly. There is no tenderness. There is no rebound, no guarding, no tenderness at McBurney's point and negative Murphy's sign. No hernia.  Musculoskeletal: Normal range of motion.  Neurological: She is alert and oriented to person, place, and time. She has normal strength. No cranial nerve deficit or sensory deficit. Coordination normal. GCS eye subscore is 4. GCS verbal subscore is 5. GCS motor subscore is 6.  Skin: Skin is warm, dry  and intact. No rash noted. No cyanosis.  Psychiatric: She has a normal mood and affect. Her speech is normal and behavior is normal. Thought content normal.  Nursing note and vitals reviewed.    ED Treatments / Results  Labs (all labs ordered are listed, but only abnormal results are displayed) Labs Reviewed  BASIC METABOLIC PANEL - Abnormal; Notable for the following:       Result Value   Glucose, Bld 115 (*)    Creatinine, Ser 1.40 (*)    GFR calc non Af Amer 33 (*)    GFR calc Af Amer 38 (*)    All other components within normal limits  CBC  TSH  I-STAT  TROPOININ, ED  I-STAT TROPOININ, ED    EKG  EKG Interpretation  Date/Time:  Wednesday September 23 2016 05:35:53 EDT Ventricular Rate:  58 PR Interval:    QRS Duration: 88 QT Interval:  399 QTC Calculation: 392 R Axis:   48 Text Interpretation:  Sinus arrhythmia Minimal ST depression, lateral leads Confirmed by Orpah Greek [phone]) on 09/23/2016 5:52:59 AM Also confirmed by Orpah Greek [phone]), editor Verna Czech [phone])  on 09/23/2016 7:49:33 AM       Radiology Dg Chest 2 View  Result Date: 09/23/2016 CLINICAL DATA:  Palpitations. EXAM: CHEST  2 VIEW COMPARISON:  04/16/2008 FINDINGS: The cardiomediastinal contours are normal. Atherosclerosis of the aortic arch. Pulmonary vasculature is normal. No consolidation, pleural effusion, or pneumothorax. No acute osseous abnormalities are seen. IMPRESSION: No acute abnormality.  Thoracic aortic atherosclerosis. Electronically Signed   By: Jeb Levering M.D.   On: 09/23/2016 06:07   Ct Abdomen Pelvis W Contrast  Result Date: 09/23/2016 CLINICAL DATA:  Abdominal pain for 2 days, palpable abnormality on the right EXAM: CT ABDOMEN AND PELVIS WITH CONTRAST TECHNIQUE: Multidetector CT imaging of the abdomen and pelvis was performed using the standard protocol following bolus administration of intravenous contrast. CONTRAST:  33mL ISOVUE-300 IOPAMIDOL (ISOVUE-300) INJECTION 61% COMPARISON:  None. FINDINGS: Lower chest: No acute abnormality. Significant coronary calcifications are noted. Hepatobiliary: Mild fatty infiltration of the liver is seen. The gallbladder is within normal limits. Pancreas: Unremarkable. No pancreatic ductal dilatation or surrounding inflammatory changes. Spleen: Normal in size without focal abnormality. Adrenals/Urinary Tract: The adrenal glands are within normal limits. Minimal scarring is noted in the left kidney. Normal excretion of contrast is seen. No stones or obstructive changes are seen. The bladder is  decompressed. Stomach/Bowel: The appendix is not well visualized. No inflammatory changes are seen. Diffuse diverticular change of the colon is noted. No obstructive changes are seen. Vascular/Lymphatic: Diffuse aortic calcifications are noted without significant aneurysmal dilatation. No significant lymphadenopathy is noted. Reproductive: Uterus and bilateral adnexa are unremarkable. Other: A lipoma is noted in the right lateral abdominal wall measuring 6 cm in greatest dimension. This would correspond with the palpable abnormality Musculoskeletal: Degenerative changes of the lumbar spine are seen. Chronic L5 compression deformity is seen. IMPRESSION: Right abdominal wall lipoma which corresponds with the patient's palpable abnormality. Diverticulosis without evidence of diverticulitis. Chronic changes as described above.  No acute abnormality noted. Electronically Signed   By: Inez Catalina M.D.   On: 09/23/2016 09:39    Procedures Procedures (including critical care time)  Medications Ordered in ED Medications  iopamidol (ISOVUE-300) 61 % injection (75 mLs  Contrast Given 09/23/16 0911)     Initial Impression / Assessment and Plan / ED Course  I have reviewed the triage vital signs and the nursing notes.  Pertinent labs & imaging results that were available during my care of the patient were reviewed by me and considered in my medical decision making (see chart for details).     Patient presents to the ER for evaluation of palpitations. Etiology is unclear. She has had brief episodes of palpitations in the past, but has had prolonged palpitations over the last 24 hours. She has not been symptomatically in the ER, no arrhythmia noted. Lab work unremarkable. Patient experiencing borderline bradycardia here, on Tenormin, will not tolerate increased dose. No sign of malignant arrhythmia here in the ER, patient appears well, appropriate for outpatient follow-up.  Addendum: Upon recheck patient  complaining of abdominal pain. She did have some tenderness in the epigastric and upper abdomen on upon reexamination. Patient ordered CT scan. Signed out to oncoming ER physician to follow-up on CT scan. Anticipate discharge if CT is negative.  Final Clinical Impressions(s) / ED Diagnoses   Final diagnoses:  Heart palpitations  Pain of upper abdomen    New Prescriptions Discharge Medication List as of 09/23/2016 10:56 AM       Betsey Holiday Gwenyth Allegra, MD 09/24/16 [phone]

## 2016-09-23 NOTE — ED Triage Notes (Signed)
Patient here after having two episodes of palpitations.  Patient states that she has one last night before midnight that lasted about one hour.  A second episode started this am around 0230 that awoke her from sleep.  She denies any shortness of breath, nausea, vomiting or chest pain.  She is CAOx4.

## 2016-09-23 NOTE — ED Notes (Signed)
Patient transported to CT 

## 2016-10-27 ENCOUNTER — Emergency Department (HOSPITAL_COMMUNITY): Payer: Medicare Other

## 2016-10-27 ENCOUNTER — Encounter (HOSPITAL_COMMUNITY): Payer: Self-pay | Admitting: Emergency Medicine

## 2016-10-27 ENCOUNTER — Emergency Department (HOSPITAL_COMMUNITY)
Admission: EM | Admit: 2016-10-27 | Discharge: 2016-10-27 | Disposition: A | Discharge status: Home / Self Care | Payer: Medicare Other | Attending: Emergency Medicine | Admitting: Emergency Medicine

## 2016-10-27 DIAGNOSIS — I10 Essential (primary) hypertension: Secondary | ICD-10-CM | POA: Insufficient documentation

## 2016-10-27 DIAGNOSIS — Z79899 Other long term (current) drug therapy: Secondary | ICD-10-CM | POA: Insufficient documentation

## 2016-10-27 DIAGNOSIS — R002 Palpitations: Secondary | ICD-10-CM | POA: Diagnosis present

## 2016-10-27 DIAGNOSIS — Z87891 Personal history of nicotine dependence: Secondary | ICD-10-CM | POA: Insufficient documentation

## 2016-10-27 DIAGNOSIS — I4891 Unspecified atrial fibrillation: Secondary | ICD-10-CM | POA: Diagnosis not present

## 2016-10-27 LAB — BASIC METABOLIC PANEL
ANION GAP: 9 (ref 5–15)
BUN: 23 mg/dL — AB (ref 6–20)
CHLORIDE: 102 mmol/L (ref 101–111)
CO2: 24 mmol/L (ref 22–32)
Calcium: 8.6 mg/dL — ABNORMAL LOW (ref 8.9–10.3)
Creatinine, Ser: 1.28 mg/dL — ABNORMAL HIGH (ref 0.44–1.00)
GFR calc Af Amer: 43 mL/min — ABNORMAL LOW (ref >60)
GFR, EST NON AFRICAN AMERICAN: 37 mL/min — AB (ref >60)
Glucose, Bld: 117 mg/dL — ABNORMAL HIGH (ref 65–99)
POTASSIUM: 4.2 mmol/L (ref 3.5–5.1)
Sodium: 135 mmol/L (ref 135–145)

## 2016-10-27 LAB — CBC
HEMATOCRIT: 38 % (ref 36.0–46.0)
HEMOGLOBIN: 12.6 g/dL (ref 12.0–15.0)
MCH: 32.4 pg (ref 26.0–34.0)
MCHC: 33.2 g/dL (ref 30.0–36.0)
MCV: 97.7 fL (ref 78.0–100.0)
Platelets: 236 10*3/uL (ref 150–400)
RBC: 3.89 MIL/uL (ref 3.87–5.11)
RDW: 12.3 % (ref 11.5–15.5)
WBC: 7.9 10*3/uL (ref 4.0–10.5)

## 2016-10-27 MED ORDER — RIVAROXABAN 20 MG PO TABS: 20.0000 mg | ORAL_TABLET | Freq: Once | ORAL | Status: DC

## 2016-10-27 MED ORDER — RIVAROXABAN 15 MG PO TABS
15.0000 mg | ORAL_TABLET | Freq: Once | ORAL | Status: AC
  Administered 2016-10-27: 15 mg via ORAL
  Filled 2016-10-27: qty 1

## 2016-10-27 MED ORDER — METOPROLOL TARTRATE 5 MG/5ML IV SOLN
5.0000 mg | Freq: Once | INTRAVENOUS | Status: AC
  Administered 2016-10-27: 5 mg via INTRAVENOUS
  Filled 2016-10-27: qty 5

## 2016-10-27 MED ORDER — METOPROLOL SUCCINATE ER 25 MG PO TB24: 25.0000 mg | ORAL_TABLET | Freq: Every day | ORAL | 0 refills | Status: DC

## 2016-10-27 MED ORDER — RIVAROXABAN (XARELTO) EDUCATION KIT FOR AFIB PATIENTS
PACK | Freq: Once | Status: AC
  Administered 2016-10-27: 19:00:00
  Filled 2016-10-27: qty 1

## 2016-10-27 MED ORDER — RIVAROXABAN 15 MG PO TABS: 15.0000 mg | ORAL_TABLET | Freq: Every day | ORAL | 0 refills | Status: AC

## 2016-10-27 MED ORDER — AMIODARONE HCL 200 MG PO TABS: 200.0000 mg | ORAL_TABLET | Freq: Every day | ORAL | 0 refills | Status: AC

## 2016-10-27 MED ORDER — AMIODARONE IV BOLUS ONLY 150 MG/100ML
150.0000 mg | Freq: Once | INTRAVENOUS | Status: AC
  Administered 2016-10-27: 150 mg via INTRAVENOUS
  Filled 2016-10-27: qty 100

## 2016-10-27 NOTE — ED Notes (Signed)
Dr Billy Fischer came in while I was giving metoprolol already gave 2mg  she said to discontinue and she is going to order amiodarone

## 2016-10-27 NOTE — ED Triage Notes (Signed)
Per EMS Pt from home felt her chest fluttering, having palpitations denies CP, no h/s of a-fib but currently in a-fib with controlled rate

## 2016-10-27 NOTE — ED Provider Notes (Signed)
[facility] DEPT Provider Note   CSN: [id number] Arrival date & time: 10/27/16  1518     History   Chief Complaint Chief Complaint  Patient presents with  . Atrial Fibrillation    HPI Christine Conley is a 83 y.o. female.  HPI   Heart palpitations started a few weeks ago, today when laying down noticed it at 1230PM.  Have been on and off over last few weeks but not bad, not as severe as they are today.  Drank Dr. Malachi Bonds at lunch, not sure if it was the caffeine.  No chest pain or dyspnea.  Today abdomen has felt swollen. Last time did CT abdomen for same.  Felt lightheaded one or two times, feel flushed.  No new medications.  Went to eye doctor this morning, given abx eye drops for stye. No other recent illness, no fevers, no cough, no vomiting/diarrhea. Takes atenolol. Rx for diazepam as needed.   Past Medical History:  Diagnosis Date  . Hypertension     There are no active problems to display for this patient.   Past Surgical History:  Procedure Laterality Date  . KNEE SURGERY    . OVARIAN CYST SURGERY      OB History    No data available       Home Medications    Prior to Admission medications   Medication Sig Start Date End Date Taking? Authorizing Provider  amiodarone (PACERONE) 200 MG tablet Take 1 tablet (200 mg total) by mouth daily. 10/27/16   Gareth Morgan, MD  metoprolol succinate (TOPROL-XL) 25 MG 24 hr tablet Take 1 tablet (25 mg total) by mouth daily. 10/27/16   Gareth Morgan, MD  pantoprazole (PROTONIX) 40 MG tablet Take 40 mg by mouth daily. 08/10/16   [provider]  Rivaroxaban (XARELTO) 15 MG TABS tablet Take 1 tablet (15 mg total) by mouth daily with supper. 10/27/16   Gareth Morgan, MD  vitamin B-12 (CYANOCOBALAMIN) 250 MCG tablet Take 250 mcg by mouth daily.    [provider]    Family History No family history on file.  Social History Social History  Substance Use Topics  . Smoking status: Former Research scientist (life sciences)  .  Smokeless tobacco: Never Used  . Alcohol use No     Allergies   Aspirin; Codeine; Meloxicam; and Penicillins   Review of Systems Review of Systems  Constitutional: Negative for fever.  HENT: Negative for sore throat.   Eyes: Negative for visual disturbance.  Respiratory: Negative for cough and shortness of breath.   Cardiovascular: Positive for palpitations. Negative for chest pain.  Gastrointestinal: Negative for abdominal pain, diarrhea, nausea and vomiting.  Genitourinary: Negative for difficulty urinating.  Musculoskeletal: Negative for back pain and neck pain.  Skin: Negative for rash.  Neurological: Negative for syncope and headaches.     Physical Exam Updated Vital Signs BP (!) 120/57   Pulse 64   Temp 98.2 F (36.8 C) (Oral)   Resp 20   Ht '5\' 3"'$  (1.6 m)   Wt 79.4 kg (175 lb)   SpO2 96%   BMI 31.00 kg/m   Physical Exam  Constitutional: She is oriented to person, place, and time. She appears well-developed and well-nourished. No distress.  HENT:  Head: Normocephalic and atraumatic.  Eyes: Conjunctivae and EOM are normal.  Neck: Normal range of motion.  Cardiovascular: Normal rate, normal heart sounds and intact distal pulses.  An irregularly irregular rhythm present. Exam reveals no gallop and no friction rub.  No murmur heard. Pulmonary/Chest: Effort normal and breath sounds normal. No respiratory distress. She has no wheezes. She has no rales.  Abdominal: Soft. She exhibits no distension. There is no tenderness. There is no guarding.  Musculoskeletal: She exhibits no edema or tenderness.  Neurological: She is alert and oriented to person, place, and time.  Skin: Skin is warm and dry. No rash noted. She is not diaphoretic. No erythema.  Nursing note and vitals reviewed.    ED Treatments / Results  Labs (all labs ordered are listed, but only abnormal results are displayed) Labs Reviewed  BASIC METABOLIC PANEL - Abnormal; Notable for the following:        Result Value   Glucose, Bld 117 (*)    BUN 23 (*)    Creatinine, Ser 1.28 (*)    Calcium 8.6 (*)    GFR calc non Af Amer 37 (*)    GFR calc Af Amer 43 (*)    All other components within normal limits  CBC    EKG  EKG Interpretation  Date/Time:  Tuesday October 27 2016 17:58:15 EDT Ventricular Rate:  67 PR Interval:    QRS Duration: 85 QT Interval:  408 QTC Calculation: 431 R Axis:   63 Text Interpretation:  Sinus rhythm Probable anteroseptal infarct, old Since prior ECG, rhythm is now sinus , rate is normal Confirmed by Gareth Morgan [phone]) on 10/27/2016 6:21:17 PM       Radiology Dg Chest 2 View  Result Date: 10/27/2016 CLINICAL DATA:  Chest fluttering and palpitations today. EXAM: CHEST  2 VIEW COMPARISON:  PA and lateral chest 04/16/2008 and 09/23/2016. FINDINGS: The lungs are clear. Heart size is normal. No pneumothorax or pleural effusion. Aortic atherosclerosis is noted. No acute bony abnormality. IMPRESSION: No acute disease. Atherosclerosis. Electronically Signed   By: Inge Rise M.D.   On: 10/27/2016 15:58    Procedures Procedures (including critical care time)  Medications Ordered in ED Medications  metoprolol tartrate (LOPRESSOR) injection 5 mg (5 mg Intravenous Given 10/27/16 1622)  amiodarone (NEXTERONE) IV bolus only 150 mg/100 mL (0 mg Intravenous Stopped 10/27/16 1650)  rivaroxaban (XARELTO) Education Kit for Afib patients ( Does not apply Given 10/27/16 1838)  Rivaroxaban (XARELTO) tablet 15 mg (15 mg Oral Given 10/27/16 1836)     Initial Impression / Assessment and Plan / ED Course  I have reviewed the triage vital signs and the nursing notes.  Pertinent labs & imaging results that were available during my care of the patient were reviewed by me and considered in my medical decision making (see chart for details).     83 year old female with a history of hypertension presents with concern for palpitations. Patient had presented with the same in  June. Palpitations have been on and off over several weeks. Today, on arrival, she appears to be switching between atrial fibrillation with RVR, and a normal sinus rhythm. Labs showed no significant changes. Denies chest pain, shortness of breath. Chest x-ray without acute findings. TSH checked in June was normal.  Discussed with Dr. Elnoria Howard 1E. Given patient is spontaneously converting in and out of atrial fibrillation rapidly, feel rhythm control is ideal. Gave 150 mg bolus of amiodarone. If patient maintains regular rhythm on amiodarone, plan to initiate 200 mg daily, as well as extended release metoprolol daily, and discontinue her atenolol. We'll initiate Xarelto. Discussed risks of blood thinners. Pt in normal sinus rhythm after amiodarone.  Recommend follow up with Dr. Terrence Dupont. Patient discharged in stable condition  with understanding of reasons to return.   Final Clinical Impressions(s) / ED Diagnoses   Final diagnoses:  Atrial fibrillation with RVR [facility])    New Prescriptions Discharge Medication List as of 10/27/2016  6:20 PM    START taking these medications   Details  amiodarone (PACERONE) 200 MG tablet Take 1 tablet (200 mg total) by mouth daily., Starting Tue 10/27/2016, Print    metoprolol succinate (TOPROL-XL) 25 MG 24 hr tablet Take 1 tablet (25 mg total) by mouth daily., Starting Tue 10/27/2016, Print    Rivaroxaban (XARELTO) 15 MG TABS tablet Take 1 tablet (15 mg total) by mouth daily with supper., Starting Tue 10/27/2016, Print         Gareth Morgan, MD 10/28/16 [phone]

## 2016-10-27 NOTE — Discharge Instructions (Signed)

## 2016-10-28 ENCOUNTER — Telehealth (HOSPITAL_COMMUNITY): Payer: Self-pay | Admitting: *Deleted

## 2016-10-28 NOTE — Telephone Encounter (Signed)
Pt on Afib ED report but is followed by Dr. Terrence Dupont.  Pt instructed to sched with Dr. Zenia Resides office.  No action needed

## 2016-11-12 ENCOUNTER — Emergency Department (HOSPITAL_COMMUNITY)
Admission: EM | Admit: 2016-11-12 | Discharge: 2016-11-13 | Disposition: A | Discharge status: Home / Self Care | Payer: Medicare Other | Attending: Emergency Medicine | Admitting: Emergency Medicine

## 2016-11-12 DIAGNOSIS — Z79899 Other long term (current) drug therapy: Secondary | ICD-10-CM | POA: Insufficient documentation

## 2016-11-12 DIAGNOSIS — I480 Paroxysmal atrial fibrillation: Secondary | ICD-10-CM

## 2016-11-12 DIAGNOSIS — Z87891 Personal history of nicotine dependence: Secondary | ICD-10-CM | POA: Diagnosis not present

## 2016-11-12 DIAGNOSIS — I10 Essential (primary) hypertension: Secondary | ICD-10-CM | POA: Diagnosis not present

## 2016-11-12 DIAGNOSIS — R002 Palpitations: Secondary | ICD-10-CM | POA: Diagnosis present

## 2016-11-13 ENCOUNTER — Emergency Department (HOSPITAL_COMMUNITY): Payer: Medicare Other

## 2016-11-13 DIAGNOSIS — I480 Paroxysmal atrial fibrillation: Secondary | ICD-10-CM | POA: Diagnosis not present

## 2016-11-13 LAB — BASIC METABOLIC PANEL
ANION GAP: 7 (ref 5–15)
BUN: 16 mg/dL (ref 6–20)
CALCIUM: 7.6 mg/dL — AB (ref 8.9–10.3)
CO2: 25 mmol/L (ref 22–32)
CREATININE: 1.18 mg/dL — AB (ref 0.44–1.00)
Chloride: 104 mmol/L (ref 101–111)
GFR calc Af Amer: 47 mL/min — ABNORMAL LOW (ref >60)
GFR, EST NON AFRICAN AMERICAN: 41 mL/min — AB (ref >60)
Glucose, Bld: 98 mg/dL (ref 65–99)
Potassium: 3.5 mmol/L (ref 3.5–5.1)
SODIUM: 136 mmol/L (ref 135–145)

## 2016-11-13 LAB — CBC
HCT: 34.3 % — ABNORMAL LOW (ref 36.0–46.0)
Hemoglobin: 11.5 g/dL — ABNORMAL LOW (ref 12.0–15.0)
MCH: 32.4 pg (ref 26.0–34.0)
MCHC: 33.5 g/dL (ref 30.0–36.0)
MCV: 96.6 fL (ref 78.0–100.0)
Platelets: 194 10*3/uL (ref 150–400)
RBC: 3.55 MIL/uL — AB (ref 3.87–5.11)
RDW: 12.3 % (ref 11.5–15.5)
WBC: 5.7 10*3/uL (ref 4.0–10.5)

## 2016-11-13 MED ORDER — METOPROLOL TARTRATE 5 MG/5ML IV SOLN
5.0000 mg | Freq: Once | INTRAVENOUS | Status: AC
  Administered 2016-11-13: 5 mg via INTRAVENOUS
  Filled 2016-11-13: qty 5

## 2016-11-13 MED ORDER — SODIUM CHLORIDE 0.9 % IV BOLUS (SEPSIS)
500.0000 mL | Freq: Once | INTRAVENOUS | Status: AC
  Administered 2016-11-13: 500 mL via INTRAVENOUS

## 2016-11-13 MED ORDER — METOPROLOL SUCCINATE ER 50 MG PO TB24: 50.0000 mg | ORAL_TABLET | Freq: Every day | ORAL | 0 refills | Status: DC

## 2016-11-13 NOTE — ED Triage Notes (Signed)
Pt from home via [facility]. C/o heart palpitations upon waking w/ SHOB. Denies any other s/s @ this time. Was dx x1wk ago w/ Afib & just recently started on medications. Denies any pain. A&O x4 on arrival.

## 2016-11-13 NOTE — ED Provider Notes (Addendum)
[facility] DEPT Provider Note   CSN: [id number] Arrival date & time:     By signing my name below, I, Ny'Kea Lewis, attest that this documentation has been prepared under the direction and in the presence of Jola Schmidt, MD. Electronically Signed: Lise Auer, ED Scribe. 11/13/16. 12:29 AM.  History   Chief Complaint Chief Complaint  Patient presents with  . Palpitations   The history is provided by the patient. No language interpreter was used.    HPI HPI Comments: Christine Conley is a 83 y.o. female with a history of hypertension, brought in by ambulance, who presents to the Emergency Department complaining of sudden onset, persistent, palpations that began PTA. Pt reports previous similar episodes that resolve after a few minutes but she reports this this time "it didn't seems to go away". She is currently compliant with her Xarelto. She reports otherwise she has been at her baseline. No acute associated symptoms noted at this time.   Past Medical History:  Diagnosis Date  . Hypertension    There are no active problems to display for this patient.  Past Surgical History:  Procedure Laterality Date  . KNEE SURGERY    . OVARIAN CYST SURGERY     OB History    No data available     Home Medications    Prior to Admission medications   Medication Sig Start Date End Date Taking? Authorizing Provider  amiodarone (PACERONE) 200 MG tablet Take 1 tablet (200 mg total) by mouth daily. 10/27/16   Gareth Morgan, MD  metoprolol succinate (TOPROL-XL) 25 MG 24 hr tablet Take 1 tablet (25 mg total) by mouth daily. 10/27/16   Gareth Morgan, MD  pantoprazole (PROTONIX) 40 MG tablet Take 40 mg by mouth daily. 08/10/16   [provider]  Rivaroxaban (XARELTO) 15 MG TABS tablet Take 1 tablet (15 mg total) by mouth daily with supper. 10/27/16   Gareth Morgan, MD  vitamin B-12 (CYANOCOBALAMIN) 250 MCG tablet Take 250 mcg by mouth daily.    [provider]   Family  History No family history on file.  Social History Social History  Substance Use Topics  . Smoking status: Former Research scientist (life sciences)  . Smokeless tobacco: Never Used  . Alcohol use No   Allergies   Aspirin; Codeine; Meloxicam; and Penicillins  Review of Systems Review of Systems A complete 10 system review of systems was obtained and all systems are negative except as noted in the HPI and PMH.   Physical Exam Updated Vital Signs BP (!) 177/79 (BP Location: Right Arm)   Pulse (!) 103   Resp (!) 26   Ht 5\' 3"  (1.6 m)   Wt 175 lb (79.4 kg)   SpO2 99%   BMI 31.00 kg/m   Physical Exam  Constitutional: She is oriented to person, place, and time. She appears well-developed and well-nourished. No distress.  HENT:  Head: Normocephalic and atraumatic.  Eyes: EOM are normal.  Neck: Normal range of motion.  Cardiovascular: Normal rate and normal heart sounds.   Irregular irregular heart rate.  Pulmonary/Chest: Effort normal and breath sounds normal.  Abdominal: Soft. She exhibits no distension. There is no tenderness.  Musculoskeletal: Normal range of motion.  Neurological: She is alert and oriented to person, place, and time.  Skin: Skin is warm and dry.  Psychiatric: She has a normal mood and affect. Judgment normal.  Nursing note and vitals reviewed.  ED Treatments / Results  DIAGNOSTIC STUDIES: Oxygen Saturation is 99% on RA,  normal by my interpretation.   COORDINATION OF CARE: 12:16 AM-Discussed next steps with pt. Pt verbalized understanding and is agreeable with the plan.   Labs (all labs ordered are listed, but only abnormal results are displayed) Labs Reviewed  BASIC METABOLIC PANEL - Abnormal; Notable for the following:       Result Value   Creatinine, Ser 1.18 (*)    Calcium 7.6 (*)    GFR calc non Af Amer 41 (*)    GFR calc Af Amer 47 (*)    All other components within normal limits  CBC - Abnormal; Notable for the following:    RBC 3.55 (*)    Hemoglobin 11.5 (*)     HCT 34.3 (*)    All other components within normal limits    EKG  EKG Interpretation  Date/Time:  Friday November 13 2016 00:00:36 EDT Ventricular Rate:  107 PR Interval:    QRS Duration: 83 QT Interval:  363 QTC Calculation: 452 R Axis:   59 Text Interpretation:  Sinus tachycardia Atrial premature complexes Abnormal R-wave progression, early transition Confirmed by Jola Schmidt [id number]) on 11/13/2016 3:18:20 AM       ECG interpretation #2   Date: 11/13/2016  Rate: 63   Rhythm: normal sinus rhythm  QRS Axis: normal  Intervals: normal  ST/T Wave abnormalities: normal  Conduction Disutrbances: none  Narrative Interpretation:   Old EKG Reviewed: no longer in afib compared to her telemetry strip     Radiology Dg Chest 2 View  Result Date: 11/13/2016 CLINICAL DATA:  Palpitations, awakening with dyspnea. EXAM: CHEST  2 VIEW COMPARISON:  10/27/2016 FINDINGS: The lungs are clear. The pulmonary vasculature is normal. Heart size is normal. Hilar and mediastinal contours are unremarkable. There is no pleural effusion. IMPRESSION: No active cardiopulmonary disease. Electronically Signed   By: Andreas Newport M.D.   On: 11/13/2016 01:28    Procedures Procedures (including critical care time)  Medications Ordered in ED Medications - No data to display  Initial Impression / Assessment and Plan / ED Course  I have reviewed the triage vital signs and the nursing notes.  Pertinent labs & imaging results that were available during my care of the patient were reviewed by me and considered in my medical decision making (see chart for details).     Patient paroxysmal A. fib.  She was recent started on amiodarone.  She needs to follow-up with her cardiologist.  Initially she showed up in normal sinus rhythm with PACs but then during my evaluation the patient was in atrial fibrillation on the monitor.  She received 5 mg IV Lopressor with conversion to sinus rhythm.  She is on  Xarelto.  I will increase her Toprol-XL from 25 mg daily to 50 mg daily.  Cardiology follow-up  CHA2DS2/VAS Stroke Risk Points      4 >= 2 Points: High Risk  1 - 1.99 Points: Medium Risk  0 Points: Low Risk    The patient's score has not changed in the past year.:  No Change         Details    Note: External data might be a factor in metrics not marked with    Points Metrics   This score determines the patient's risk of having a stroke if the  patient has atrial fibrillation.       0 Has Congestive Heart Failure:  No   0 Has Vascular Disease:  No   1 Has Hypertension:  Yes  2 Age:  75   0 Has Diabetes:  No   0 Had Stroke:  No Had TIA:  No Had thromboembolism:  No   1 Female:  Yes          Final Clinical Impressions(s) / ED Diagnoses   Final diagnoses:  Paroxysmal atrial fibrillation [facility])   New Prescriptions Current Discharge Medication List     I personally performed the services described in this documentation, which was scribed in my presence. The recorded information has been reviewed and is accurate.        Jola Schmidt, MD 11/13/16 Conni Elliot    Jola Schmidt, MD 11/13/16 4718    Jola Schmidt, MD 11/13/16 [phone]

## 2016-12-28 ENCOUNTER — Ambulatory Visit: Payer: PRIVATE HEALTH INSURANCE | Admitting: Neurology

## 2017-02-10 ENCOUNTER — Ambulatory Visit (INDEPENDENT_AMBULATORY_CARE_PROVIDER_SITE_OTHER): Payer: Medicare Other | Admitting: Neurology

## 2017-02-10 ENCOUNTER — Encounter (INDEPENDENT_AMBULATORY_CARE_PROVIDER_SITE_OTHER): Payer: Self-pay

## 2017-02-10 ENCOUNTER — Encounter: Payer: Self-pay | Admitting: Neurology

## 2017-02-10 VITALS — BP 135/60 | HR 58 | Ht 63.0 in | Wt 173.5 lb

## 2017-02-10 DIAGNOSIS — R413 Other amnesia: Secondary | ICD-10-CM | POA: Diagnosis not present

## 2017-02-10 NOTE — Progress Notes (Signed)
PATIENT: Christine Conley DOB: 10-26-1928  Chief Complaint  Patient presents with  . Memory Loss    MMSE 26/30 - 13 animals.  She is here with her son, Yvone Neu, to have her worsening short-term memory evaluated.  . Tremors    Reports head tremors and wringing of her hands.  Marland Kitchen PCP    [name], MD     HISTORICAL  Christine Conley is 83 year old right-handed female accompanied by her son Yvone Neu, seen in refer by her primary care doctor [name] for evaluation of memory loss, initial evaluation was February 10 2017.  I reviewed and summarized the referring note he has history of paroxysmal atrial fibrillation, acid reflux, tremor,  She graduate from high school, used to work as a Engineer, water for [employer], since retirement, she lives alone until September 2018, she enjoys reading, painting greeting card.  Since 2017, she was noted to have gradual onset memory loss, tends to repeat questions, worsening confusion during her most recent UTI in September 2018, she tends to forget her medications name, misplace things. In addition, she developed delusion, thought people was knocking on her doors, is afraid to stay alone, called policeman few times.  In June, she developed palpitation, was diagnosed with atrial fibrillation, is taking amiodarone, metoprolol,   Her son also reported that she has lost a lot of interest, she used to love to read,  MRI of the brain on June 18 2016, no acute abnormality, mild to moderate diffuse parenchymal volume loss   A1c was 5.2, evidence of Escherichia coli UTI on December 30 2016, normal CBC hemoglobin of 12.6, CMP, creatinine was elevated 1.3 5, folic acid more than 20, TSH 1.05, lipid profile, cholesterol was elevated 237, triglyceride was 151, LDL was 149,  Repeat labs in July 2018 almost a hemoglobin was 11.5,  REVIEW OF SYSTEMS: Full 14 system review of systems performed and notable only for memory loss, confusion,  palpitation  ALLERGIES: Allergies  Allergen Reactions  . Aspirin     PVC's  . Codeine     hallucinations  . Meloxicam   . Penicillins     Localized mass, 'size of grapefruit'    HOME MEDICATIONS: Current Outpatient Prescriptions  Medication Sig Dispense Refill  . amiodarone (PACERONE) 200 MG tablet Take 1 tablet (200 mg total) by mouth daily. 60 tablet 0  . metoprolol succinate (TOPROL-XL) 50 MG 24 hr tablet Take 1 tablet (50 mg total) by mouth daily. 30 tablet 0  . pantoprazole (PROTONIX) 40 MG tablet Take 40 mg by mouth daily.  0  . Rivaroxaban (XARELTO) 15 MG TABS tablet Take 1 tablet (15 mg total) by mouth daily with supper. 42 tablet 0  . vitamin B-12 (CYANOCOBALAMIN) 250 MCG tablet Take 250 mcg by mouth daily.     No current facility-administered medications for this visit.     PAST MEDICAL HISTORY: Past Medical History:  Diagnosis Date  . Acid reflux   . Anxiety   . Atrial fibrillation ([place])   . Hypertension     PAST SURGICAL HISTORY: Past Surgical History:  Procedure Laterality Date  . KNEE SURGERY Left    Replacement  . OVARIAN CYST SURGERY      FAMILY HISTORY: Family History  Problem Relation Age of Onset  . Other Mother        died at 32 - old age  . Heart disease Father        died at 42  SOCIAL HISTORY:  Social History   Social History  . Marital status: Widowed    Spouse name: N/A  . Number of children: 3  . Years of education: 12   Occupational History  . Retired    Social History Main Topics  . Smoking status: Former Research scientist (life sciences)  . Smokeless tobacco: Never Used  . Alcohol use No  . Drug use: No  . Sexual activity: Not on file   Other Topics Concern  . Not on file   Social History Narrative   Lives at home alone.   Right-handed.   Occasional use of caffeine.     PHYSICAL EXAM   Vitals:   02/10/17 0954  BP: 135/60  Pulse: (!) 58  Weight: 173 lb 8 oz (78.7 kg)  Height: 5\' 3"  (1.6 m)    Not recorded      Body mass  index is 30.73 kg/m.  PHYSICAL EXAMNIATION:  Gen: NAD, conversant, well nourised, obese, well groomed                     Cardiovascular: Regular rate rhythm, no peripheral edema, warm, nontender. Eyes: Conjunctivae clear without exudates or hemorrhage Neck: Supple, no carotid bruits. Pulmonary: Clear to auscultation bilaterally   NEUROLOGICAL EXAM:  MMSE - Mini Mental State Exam 02/10/2017  Orientation to time 4  Orientation to Place 5  Registration 3  Attention/ Calculation 5  Recall 1  Language- name 2 objects 2  Language- repeat 1  Language- follow 3 step command 2  Language- read & follow direction 1  Write a sentence 1  Copy design 1  Total score 26  animal naming 13   CRANIAL NERVES: CN II: Visual fields are full to confrontation. Fundoscopic exam is normal with sharp discs and no vascular changes. Pupils are round equal and briskly reactive to light. CN III, IV, VI: extraocular movement are normal. No ptosis. CN V: Facial sensation is intact to pinprick in all 3 divisions bilaterally. Corneal responses are intact.  CN VII: Face is symmetric with normal eye closure and smile. CN VIII: Hearing is normal to rubbing fingers CN IX, X: Palate elevates symmetrically. Phonation is normal. CN XI: Head turning and shoulder shrug are intact CN XII: Tongue is midline with normal movements and no atrophy.  MOTOR: There is no pronator drift of out-stretched arms. Muscle bulk and tone are normal. Muscle strength is normal.  REFLEXES: Reflexes are 2+ and symmetric at the biceps, triceps, knees, and ankles. Plantar responses are flexor.  SENSORY: Intact to light touch, pinprick, positional sensation and vibratory sensation are intact in fingers and toes.  COORDINATION: Rapid alternating movements and fine finger movements are intact. There is no dysmetria on finger-to-nose and heel-knee-shin.    GAIT/STANCE: She used to push up to get up from seated position, mildly  antalgic,   DIAGNOSTIC DATA (LABS, IMAGING, TESTING) - I reviewed patient records, labs, notes, testing and imaging myself where available.   ASSESSMENT AND PLAN  Christine Conley is a 83 y.o. female   Mild cognitive impairment  Mini-Mental Status Examination 26/30, animal naming of 70  Bring MRI brain CD to review  Laboratory evaluations T24, RPR, Folic acid.     Marcial Pacas, M.D. Ph.D.  [facility] [address] [place], [place] [address] Ph: [phone] Fax: [phone]  [id number], Rebeca Alert, MD

## 2017-02-11 ENCOUNTER — Telehealth: Payer: Self-pay | Admitting: *Deleted

## 2017-02-11 LAB — FOLATE: FOLATE: 13.1 ng/mL (ref >3.0)

## 2017-02-11 LAB — RPR: RPR Ser Ql: NONREACTIVE

## 2017-02-11 LAB — VITAMIN B12: Vitamin B-12: 420 pg/mL (ref 232–1245)

## 2017-02-11 NOTE — Telephone Encounter (Signed)
-----   Message from Marcial Pacas, MD sent at 02/11/2017  1:11 PM EDT ----- Please call patient for normal laboratory result

## 2017-02-11 NOTE — Telephone Encounter (Signed)
Spoke to patient she is aware of results

## 2017-03-30 ENCOUNTER — Emergency Department (HOSPITAL_COMMUNITY)
Admission: EM | Admit: 2017-03-30 | Discharge: 2017-03-30 | Disposition: A | Discharge status: Home / Self Care | Payer: Medicare Other | Attending: Emergency Medicine | Admitting: Emergency Medicine

## 2017-03-30 ENCOUNTER — Emergency Department (HOSPITAL_COMMUNITY): Payer: Medicare Other

## 2017-03-30 ENCOUNTER — Other Ambulatory Visit: Payer: Self-pay

## 2017-03-30 ENCOUNTER — Encounter (HOSPITAL_COMMUNITY): Payer: Self-pay

## 2017-03-30 DIAGNOSIS — I10 Essential (primary) hypertension: Secondary | ICD-10-CM | POA: Diagnosis not present

## 2017-03-30 DIAGNOSIS — R1012 Left upper quadrant pain: Secondary | ICD-10-CM | POA: Diagnosis present

## 2017-03-30 DIAGNOSIS — R1010 Upper abdominal pain, unspecified: Secondary | ICD-10-CM

## 2017-03-30 DIAGNOSIS — K5732 Diverticulitis of large intestine without perforation or abscess without bleeding: Secondary | ICD-10-CM | POA: Insufficient documentation

## 2017-03-30 DIAGNOSIS — Z87891 Personal history of nicotine dependence: Secondary | ICD-10-CM | POA: Diagnosis not present

## 2017-03-30 DIAGNOSIS — I4891 Unspecified atrial fibrillation: Secondary | ICD-10-CM | POA: Diagnosis not present

## 2017-03-30 DIAGNOSIS — K5792 Diverticulitis of intestine, part unspecified, without perforation or abscess without bleeding: Secondary | ICD-10-CM

## 2017-03-30 DIAGNOSIS — Z79899 Other long term (current) drug therapy: Secondary | ICD-10-CM | POA: Insufficient documentation

## 2017-03-30 LAB — CBC WITH DIFFERENTIAL/PLATELET
BASOS ABS: 0 10*3/uL (ref 0.0–0.1)
BASOS PCT: 0 %
EOS ABS: 0.1 10*3/uL (ref 0.0–0.7)
Eosinophils Relative: 1 %
HCT: 35 % — ABNORMAL LOW (ref 36.0–46.0)
HEMOGLOBIN: 11.4 g/dL — AB (ref 12.0–15.0)
Lymphocytes Relative: 20 %
Lymphs Abs: 1 10*3/uL (ref 0.7–4.0)
MCH: 32.2 pg (ref 26.0–34.0)
MCHC: 32.6 g/dL (ref 30.0–36.0)
MCV: 98.9 fL (ref 78.0–100.0)
MONO ABS: 0.4 10*3/uL (ref 0.1–1.0)
MONOS PCT: 9 %
NEUTROS ABS: 3.4 10*3/uL (ref 1.7–7.7)
NEUTROS PCT: 70 %
Platelets: 193 10*3/uL (ref 150–400)
RBC: 3.54 MIL/uL — ABNORMAL LOW (ref 3.87–5.11)
RDW: 12.4 % (ref 11.5–15.5)
WBC: 4.9 10*3/uL (ref 4.0–10.5)

## 2017-03-30 LAB — COMPREHENSIVE METABOLIC PANEL
ALBUMIN: 3.3 g/dL — AB (ref 3.5–5.0)
ALT: 10 U/L — ABNORMAL LOW (ref 14–54)
ANION GAP: 8 (ref 5–15)
AST: 18 U/L (ref 15–41)
Alkaline Phosphatase: 50 U/L (ref 38–126)
BUN: 24 mg/dL — AB (ref 6–20)
CO2: 27 mmol/L (ref 22–32)
Calcium: 7.9 mg/dL — ABNORMAL LOW (ref 8.9–10.3)
Chloride: 104 mmol/L (ref 101–111)
Creatinine, Ser: 1.3 mg/dL — ABNORMAL HIGH (ref 0.44–1.00)
GFR calc Af Amer: 42 mL/min — ABNORMAL LOW (ref >60)
GFR calc non Af Amer: 36 mL/min — ABNORMAL LOW (ref >60)
GLUCOSE: 105 mg/dL — AB (ref 65–99)
POTASSIUM: 3.5 mmol/L (ref 3.5–5.1)
SODIUM: 139 mmol/L (ref 135–145)
Total Bilirubin: 0.9 mg/dL (ref 0.3–1.2)
Total Protein: 6 g/dL — ABNORMAL LOW (ref 6.5–8.1)

## 2017-03-30 LAB — URINALYSIS, ROUTINE W REFLEX MICROSCOPIC
Bilirubin Urine: NEGATIVE
Glucose, UA: NEGATIVE mg/dL
Hgb urine dipstick: NEGATIVE
Ketones, ur: NEGATIVE mg/dL
LEUKOCYTES UA: NEGATIVE
NITRITE: NEGATIVE
Protein, ur: NEGATIVE mg/dL
SPECIFIC GRAVITY, URINE: 1.035 — AB (ref 1.005–1.030)
pH: 7 (ref 5.0–8.0)

## 2017-03-30 LAB — TROPONIN I: Troponin I: <0.03 ng/mL (ref <0.03)

## 2017-03-30 LAB — LIPASE, BLOOD: Lipase: 30 U/L (ref 11–51)

## 2017-03-30 MED ORDER — METRONIDAZOLE 500 MG PO TABS: 500.0000 mg | ORAL_TABLET | Freq: Two times a day (BID) | ORAL | 0 refills | Status: DC

## 2017-03-30 MED ORDER — FENTANYL CITRATE (PF) 100 MCG/2ML IJ SOLN
50.0000 ug | Freq: Once | INTRAMUSCULAR | Status: AC
  Administered 2017-03-30: 50 ug via INTRAVENOUS
  Filled 2017-03-30: qty 2

## 2017-03-30 MED ORDER — HYDROCODONE-ACETAMINOPHEN 5-325 MG PO TABS: 1.0000 | ORAL_TABLET | Freq: Four times a day (QID) | ORAL | 0 refills | Status: DC | PRN

## 2017-03-30 MED ORDER — METRONIDAZOLE IN NACL 5-0.79 MG/ML-% IV SOLN
500.0000 mg | Freq: Once | INTRAVENOUS | Status: AC
  Administered 2017-03-30: 500 mg via INTRAVENOUS
  Filled 2017-03-30: qty 100

## 2017-03-30 MED ORDER — CIPROFLOXACIN HCL 500 MG PO TABS: 500.0000 mg | ORAL_TABLET | Freq: Two times a day (BID) | ORAL | 0 refills | Status: DC

## 2017-03-30 MED ORDER — IOPAMIDOL (ISOVUE-300) INJECTION 61%
INTRAVENOUS | Status: AC
  Administered 2017-03-30: 75 mL
  Filled 2017-03-30: qty 75

## 2017-03-30 MED ORDER — HYDROCODONE-ACETAMINOPHEN 5-325 MG PO TABS
1.0000 | ORAL_TABLET | Freq: Once | ORAL | Status: AC
  Administered 2017-03-30: 1 via ORAL
  Filled 2017-03-30: qty 1

## 2017-03-30 MED ORDER — CIPROFLOXACIN IN D5W 400 MG/200ML IV SOLN
400.0000 mg | Freq: Once | INTRAVENOUS | Status: AC
  Administered 2017-03-30: 400 mg via INTRAVENOUS
  Filled 2017-03-30: qty 200

## 2017-03-30 MED ORDER — FENTANYL CITRATE (PF) 100 MCG/2ML IJ SOLN
25.0000 ug | Freq: Once | INTRAMUSCULAR | Status: AC
  Administered 2017-03-30: 25 ug via INTRAVENOUS
  Filled 2017-03-30: qty 2

## 2017-03-30 NOTE — ED Notes (Signed)
ED Provider at bedside. 

## 2017-03-30 NOTE — ED Provider Notes (Signed)
[facility] EMERGENCY DEPARTMENT Provider Note   CSN: [id number] Arrival date & time: 03/30/17  0741     History   Chief Complaint Chief Complaint  Patient presents with  . Abdominal Pain    HPI Christine Conley is a 83 y.o. female.  HPI Patient has had episodic left upper quadrant pain for the past year.  States she woke up this morning with worsening pain.  States the pain is worse with deep inspiration and movement.  She has had urinary frequency but denies dysuria or hematuria.  No nausea, vomiting, diarrhea or constipation.  No known blood in the stool or melanotic stools.  No fever or chills.  Denies chest pain or cough.  No new lower extremity swelling.  Patient is on Xarelto for atrial fibrillation. Past Medical History:  Diagnosis Date  . Acid reflux   . Anxiety   . Atrial fibrillation ([place])   . Hypertension     Patient Active Problem List   Diagnosis Date Noted  . Memory loss 02/10/2017    Past Surgical History:  Procedure Laterality Date  . KNEE SURGERY Left    Replacement  . OVARIAN CYST SURGERY      OB History    No data available       Home Medications    Prior to Admission medications   Medication Sig Start Date End Date Taking? Authorizing Provider  amiodarone (PACERONE) 200 MG tablet Take 1 tablet (200 mg total) by mouth daily. 10/27/16  Yes Gareth Morgan, MD  furosemide (LASIX) 20 MG tablet Take 20 mg by mouth daily. 03/15/17  Yes [provider]  metoprolol succinate (TOPROL-XL) 50 MG 24 hr tablet Take 1 tablet (50 mg total) by mouth daily. 11/13/16  Yes Jola Schmidt, MD  pantoprazole (PROTONIX) 40 MG tablet Take 40 mg by mouth daily. 08/10/16  Yes [provider]  Rivaroxaban (XARELTO) 15 MG TABS tablet Take 1 tablet (15 mg total) by mouth daily with supper. 10/27/16  Yes Gareth Morgan, MD  ciprofloxacin (CIPRO) 500 MG tablet Take 1 tablet (500 mg total) by mouth 2 (two) times daily. One po bid x 7 days  03/30/17   Julianne Rice, MD  HYDROcodone-acetaminophen [facility]) 5-325 MG tablet Take 1 tablet by mouth every 6 (six) hours as needed for severe pain. 03/30/17   Julianne Rice, MD  metroNIDAZOLE (FLAGYL) 500 MG tablet Take 1 tablet (500 mg total) by mouth 2 (two) times daily. One po bid x 7 days 03/30/17   Julianne Rice, MD    Family History Family History  Problem Relation Age of Onset  . Other Mother        died at 57 - old age  . Heart disease Father        died at 31    Social History Social History   Tobacco Use  . Smoking status: Former Research scientist (life sciences)  . Smokeless tobacco: Never Used  Substance Use Topics  . Alcohol use: No  . Drug use: No     Allergies   Aspirin; Codeine; Meloxicam; and Penicillins   Review of Systems Review of Systems  Constitutional: Negative for chills, fatigue and fever.  Respiratory: Negative for cough and shortness of breath.   Cardiovascular: Negative for chest pain, palpitations and leg swelling.  Gastrointestinal: Positive for abdominal pain. Negative for constipation, diarrhea, nausea and vomiting.  Genitourinary: Positive for flank pain and frequency. Negative for dysuria.  Musculoskeletal: Positive for back pain. Negative for neck pain and  neck stiffness.  Skin: Negative for rash and wound.  Neurological: Negative for dizziness, weakness, light-headedness, numbness and headaches.  All other systems reviewed and are negative.    Physical Exam Updated Vital Signs BP (!) 157/62   Pulse (!) 56   Temp 97.8 F (36.6 C) (Oral)   Resp 19   SpO2 95%   Physical Exam  Constitutional: She is oriented to person, place, and time. She appears well-developed and well-nourished.  Non-toxic appearance. She does not appear ill.  HENT:  Head: Normocephalic and atraumatic.  Mouth/Throat: Oropharynx is clear and moist.  Eyes: EOM are normal. Pupils are equal, round, and reactive to light.  Neck: Normal range of motion. Neck supple.    Cardiovascular: Normal rate and regular rhythm.  Murmur heard. Pulmonary/Chest: Effort normal and breath sounds normal. No stridor. No respiratory distress. She has no wheezes. She has no rhonchi. She has no rales. She exhibits no tenderness.  Abdominal: Soft. Bowel sounds are normal. There is tenderness. There is guarding. There is no rebound.  Left upper quadrant tenderness to palpation with guarding.  Patient also has some mild left lower quadrant and epigastric tenderness.  Musculoskeletal: Normal range of motion. She exhibits no edema or tenderness.  Left CVA tenderness with percussion.  No midline thoracic or lumbar tenderness.  Neurological: She is alert and oriented to person, place, and time.  Moves all extremities without focal deficit.  Sensation intact.  Skin: Skin is warm and dry. Capillary refill takes less than 2 seconds. No rash noted. No erythema.  Psychiatric: She has a normal mood and affect. Her behavior is normal.  Nursing note and vitals reviewed.    ED Treatments / Results  Labs (all labs ordered are listed, but only abnormal results are displayed) Labs Reviewed  CBC WITH DIFFERENTIAL/PLATELET - Abnormal; Notable for the following components:      Result Value   RBC 3.54 (*)    Hemoglobin 11.4 (*)    HCT 35.0 (*)    All other components within normal limits  COMPREHENSIVE METABOLIC PANEL - Abnormal; Notable for the following components:   Glucose, Bld 105 (*)    BUN 24 (*)    Creatinine, Ser 1.30 (*)    Calcium 7.9 (*)    Total Protein 6.0 (*)    Albumin 3.3 (*)    ALT 10 (*)    GFR calc non Af Amer 36 (*)    GFR calc Af Amer 42 (*)    All other components within normal limits  URINALYSIS, ROUTINE W REFLEX MICROSCOPIC - Abnormal; Notable for the following components:   Specific Gravity, Urine 1.035 (*)    All other components within normal limits  TROPONIN I  LIPASE, BLOOD    EKG  EKG Interpretation None       Radiology Ct Abdomen Pelvis W  Contrast  Result Date: 03/30/2017 CLINICAL DATA:  Left upper quadrant pain for several days EXAM: CT ABDOMEN AND PELVIS WITH CONTRAST TECHNIQUE: Multidetector CT imaging of the abdomen and pelvis was performed using the standard protocol following bolus administration of intravenous contrast. CONTRAST:  92mL ISOVUE-300 IOPAMIDOL (ISOVUE-300) INJECTION 61% COMPARISON:  09/23/2016 FINDINGS: Lower chest: No acute abnormality. Coronary calcifications are again noted. Hepatobiliary: Mild fatty infiltration liver is again noted. No gallstones, gallbladder wall thickening, or biliary dilatation. Pancreas: Unremarkable. No pancreatic ductal dilatation or surrounding inflammatory changes. Spleen: Normal in size without focal abnormality. Adrenals/Urinary Tract: The adrenal glands are within normal limits. The right kidney is unremarkable.  Left kidney demonstrates some focal scarring stable from the prior study. The bladder is partially decompressed. Stomach/Bowel: The appendix is not well visualized. No inflammatory changes are noted to suggest appendicitis. Diverticulosis of the colon is noted. Some very minimal pericolonic inflammatory change is seen adjacent to the sigmoid consistent with very early diverticulitis. No micro perforation or abscess formation is noted. No other obstructive or inflammatory changes are noted. Vascular/Lymphatic: Aortic atherosclerosis. No enlarged abdominal or pelvic lymph nodes. Reproductive: Uterus and bilateral adnexa are unremarkable. 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 No free fluid is seen. No free air is noted. Musculoskeletal: Degenerative change of the lumbar spine is seen. Chronic L5 compression deformity is noted. No anterolisthesis is seen. IMPRESSION: Changes consistent with very early diverticulitis in the sigmoid colon. No abscess or micro perforation is seen. Chronic changes as described above stable from the prior study. Electronically  Signed   By: Inez Catalina M.D.   On: 03/30/2017 10:31    Procedures Procedures (including critical care time)  Medications Ordered in ED Medications  HYDROcodone-acetaminophen (NORCO/VICODIN) 5-325 MG per tablet 1 tablet (not administered)  fentaNYL (SUBLIMAZE) injection 25 mcg (25 mcg Intravenous Given 03/30/17 0853)  iopamidol (ISOVUE-300) 61 % injection (75 mLs  Contrast Given 03/30/17 1011)  ciprofloxacin (CIPRO) IVPB 400 mg (0 mg Intravenous Stopped 03/30/17 1250)  metroNIDAZOLE (FLAGYL) IVPB 500 mg (0 mg Intravenous Stopped 03/30/17 1246)  fentaNYL (SUBLIMAZE) injection 50 mcg (50 mcg Intravenous Given 03/30/17 1146)     Initial Impression / Assessment and Plan / ED Course  I have reviewed the triage vital signs and the nursing notes.  Pertinent labs & imaging results that were available during my care of the patient were reviewed by me and considered in my medical decision making (see chart for details).     Patient received IV antibiotics for diverticulitis in the emergency department.  She still has mild lower abdominal discomfort to palpation.  But she is still mostly complaining of the left upper quadrant.  The pain is there even to very light palpation.  Suspect this is musculoskeletal.  This also may be pain associated with shingle though no rashes present.  Patient states her pain is improved when there is no pressure on her abdomen.  Understands need to return to the emergency department or follow-up with her primary physician should she notice a rash in the area.  She also knows to return for worsening pain, fever, persistent vomiting or for any concerns.  Both patient and son are comfortable with plan to discharge home on oral antibiotics.  Final Clinical Impressions(s) / ED Diagnoses   Final diagnoses:  Pain of upper abdomen  Diverticulitis    ED Discharge Orders        Ordered    ciprofloxacin (CIPRO) 500 MG tablet  2 times daily     03/30/17 1403     metroNIDAZOLE (FLAGYL) 500 MG tablet  2 times daily     03/30/17 1403    HYDROcodone-acetaminophen (NORCO) 5-325 MG tablet  Every 6 hours PRN     03/30/17 1403       Julianne Rice, MD 03/30/17 [id number]

## 2017-03-30 NOTE — ED Notes (Signed)
Patient transported to CT 

## 2017-03-30 NOTE — ED Notes (Signed)
Patient verbalizes understanding of discharge instructions. Opportunity for questioning and answers were provided. 

## 2017-03-30 NOTE — ED Notes (Signed)
Attempted to site iv x2 unsuccessfully, Nikki, RN in to attempt to site IV

## 2017-03-30 NOTE — ED Notes (Signed)
Pt ambulatory to bathroom with steady gait to provide urine sample.

## 2017-03-30 NOTE — ED Triage Notes (Signed)
Pt has been experiencing LUQ pain for several days, worsening this morning. Pt denies any n/v/d. Pain in the LUQ. Pt also states it hurts to take a deep breath.

## 2017-07-30 ENCOUNTER — Emergency Department (HOSPITAL_COMMUNITY): Payer: Medicare Other

## 2017-07-30 ENCOUNTER — Other Ambulatory Visit: Payer: Self-pay

## 2017-07-30 ENCOUNTER — Emergency Department (HOSPITAL_COMMUNITY)
Admission: EM | Admit: 2017-07-30 | Discharge: 2017-07-30 | Disposition: A | Discharge status: Home / Self Care | Payer: Medicare Other | Attending: Emergency Medicine | Admitting: Emergency Medicine

## 2017-07-30 DIAGNOSIS — Y999 Unspecified external cause status: Secondary | ICD-10-CM | POA: Diagnosis not present

## 2017-07-30 DIAGNOSIS — I4891 Unspecified atrial fibrillation: Secondary | ICD-10-CM | POA: Insufficient documentation

## 2017-07-30 DIAGNOSIS — Z7901 Long term (current) use of anticoagulants: Secondary | ICD-10-CM | POA: Diagnosis not present

## 2017-07-30 DIAGNOSIS — I10 Essential (primary) hypertension: Secondary | ICD-10-CM | POA: Diagnosis not present

## 2017-07-30 DIAGNOSIS — Z87891 Personal history of nicotine dependence: Secondary | ICD-10-CM | POA: Insufficient documentation

## 2017-07-30 DIAGNOSIS — Y939 Activity, unspecified: Secondary | ICD-10-CM | POA: Diagnosis not present

## 2017-07-30 DIAGNOSIS — S59911A Unspecified injury of right forearm, initial encounter: Secondary | ICD-10-CM | POA: Diagnosis present

## 2017-07-30 DIAGNOSIS — W19XXXA Unspecified fall, initial encounter: Secondary | ICD-10-CM | POA: Insufficient documentation

## 2017-07-30 DIAGNOSIS — Y929 Unspecified place or not applicable: Secondary | ICD-10-CM | POA: Diagnosis not present

## 2017-07-30 DIAGNOSIS — Z79899 Other long term (current) drug therapy: Secondary | ICD-10-CM | POA: Diagnosis not present

## 2017-07-30 DIAGNOSIS — S42291A Other displaced fracture of upper end of right humerus, initial encounter for closed fracture: Secondary | ICD-10-CM | POA: Insufficient documentation

## 2017-07-30 MED ORDER — HYDROCODONE-ACETAMINOPHEN 5-325 MG PO TABS: 1.0000 | ORAL_TABLET | Freq: Four times a day (QID) | ORAL | 0 refills | Status: DC | PRN

## 2017-07-30 NOTE — ED Notes (Signed)
Patient transported to X-ray 

## 2017-07-30 NOTE — ED Triage Notes (Signed)
Pt in via [facility] from home, per report pt fell today onto ground, denies hitting head, denies LOC, pt has obvious to R shoulder, distal pulse present, pt in towel roll for c spine immobilization,pt denies neck and back pain, pt A&O x4

## 2017-07-30 NOTE — ED Provider Notes (Signed)
[facility] EMERGENCY DEPARTMENT Provider Note   CSN: [id number] Arrival date & time: 07/30/17  1445     History   Chief Complaint Chief Complaint  Patient presents with  . Fall    HPI GIDGET QUIZHPI is a 84 y.o. female.  HPI Patient presents after fall.  Was walking down a slope and fell landing on her right shoulder.  Did not hit her head.  Pain to her proximal right shoulder.  No loss conscious.  No neck pain.  No chest or abdominal pain.  She is on Xarelto for atrial fibrillation.  No numbness or weakness. Past Medical History:  Diagnosis Date  . Acid reflux   . Anxiety   . Atrial fibrillation ([place])   . Hypertension     Patient Active Problem List   Diagnosis Date Noted  . Memory loss 02/10/2017    Past Surgical History:  Procedure Laterality Date  . KNEE SURGERY Left    Replacement  . OVARIAN CYST SURGERY       OB History   None      Home Medications    Prior to Admission medications   Medication Sig Start Date End Date Taking? Authorizing Provider  amiodarone (PACERONE) 200 MG tablet Take 1 tablet (200 mg total) by mouth daily. 10/27/16   Gareth Morgan, MD  ciprofloxacin (CIPRO) 500 MG tablet Take 1 tablet (500 mg total) by mouth 2 (two) times daily. One po bid x 7 days 03/30/17   Julianne Rice, MD  furosemide (LASIX) 20 MG tablet Take 20 mg by mouth daily. 03/15/17   [provider]  HYDROcodone-acetaminophen (NORCO/VICODIN) 5-325 MG tablet Take 1 tablet by mouth every 6 (six) hours as needed. 07/30/17   Davonna Belling, MD  metoprolol succinate (TOPROL-XL) 50 MG 24 hr tablet Take 1 tablet (50 mg total) by mouth daily. 11/13/16   Jola Schmidt, MD  metroNIDAZOLE (FLAGYL) 500 MG tablet Take 1 tablet (500 mg total) by mouth 2 (two) times daily. One po bid x 7 days 03/30/17   Julianne Rice, MD  pantoprazole (PROTONIX) 40 MG tablet Take 40 mg by mouth daily. 08/10/16   [provider]  Rivaroxaban (XARELTO) 15 MG  TABS tablet Take 1 tablet (15 mg total) by mouth daily with supper. 10/27/16   Gareth Morgan, MD    Family History Family History  Problem Relation Age of Onset  . Other Mother        died at 35 - old age  . Heart disease Father        died at 69    Social History Social History   Tobacco Use  . Smoking status: Former Research scientist (life sciences)  . Smokeless tobacco: Never Used  Substance Use Topics  . Alcohol use: No  . Drug use: No     Allergies   Aspirin; Codeine; Meloxicam; and Penicillins   Review of Systems Review of Systems  Constitutional: Negative for appetite change.  HENT: Negative for congestion.   Respiratory: Negative for shortness of breath.   Cardiovascular: Negative for chest pain.  Gastrointestinal: Negative for abdominal pain.  Genitourinary: Negative for flank pain.  Musculoskeletal: Negative for neck pain.       Right shoulder pain  Skin: Negative for rash.  Neurological: Negative for weakness.  Hematological: Negative for adenopathy.  Psychiatric/Behavioral: Negative for confusion.     Physical Exam Updated Vital Signs BP (!) 146/55   Pulse (!) 57   Temp 98.7 F (37.1 C) (Oral)  Resp 16   Ht 5\' 4"  (1.626 m)   Wt 79.4 kg (175 lb)   SpO2 100%   BMI 30.04 kg/m   Physical Exam  Constitutional: She appears well-developed and well-nourished.  HENT:  Head: Normocephalic and atraumatic.  Eyes: Pupils are equal, round, and reactive to light.  Neck: Neck supple.  No midline cervical tenderness.  Cardiovascular: Normal rate.  Pulmonary/Chest: Effort normal. She exhibits no tenderness.  Abdominal: There is no tenderness.  Musculoskeletal: She exhibits tenderness.  Tenderness over proximal right shoulder.  No tenderness over elbow with good range of motion.  Neurovascular intact in right hand.  Strong radial pulse.  No left-sided upper extremity tenderness.  Lower extremities without tenderness and good range of motion.  Neurological: She is alert.  Skin:  Skin is warm. Capillary refill takes less than 2 seconds.  Psychiatric: She has a normal mood and affect.     ED Treatments / Results  Labs (all labs ordered are listed, but only abnormal results are displayed) Labs Reviewed - No data to display  EKG None  Radiology Dg Shoulder Right  Result Date: 07/30/2017 CLINICAL DATA:  Right shoulder injury due to a fall in a driveway today. Initial encounter. EXAM: RIGHT SHOULDER - 2+ VIEW COMPARISON:  None. FINDINGS: The patient has an oblique fracture of the proximal humerus extending from the surgical neck laterally into the diaphysis distally. Fracture fragments appear mildly distracted. Bones are osteopenic. Moderate acromioclavicular degenerative change is seen. IMPRESSION: Proximal right humerus fracture extends from the lateral aspect of the surgical neck into the proximal diaphysis as described above. Osteopenia. Electronically Signed   By: Inge Rise M.D.   On: 07/30/2017 15:50    Procedures Procedures (including critical care time)  Medications Ordered in ED Medications - No data to display   Initial Impression / Assessment and Plan / ED Course  I have reviewed the triage vital signs and the nursing notes.  Pertinent labs & imaging results that were available during my care of the patient were reviewed by me and considered in my medical decision making (see chart for details).     Patient with fall.  Proximal humerus fracture.  The only injury.  No head or neck pain.  No evidence of head trauma.  She is however on anticoagulation.  Discussed with Hilbert Odor from orthopedic surgery.  Will immobilize and have follow-up with orthopedic surgeon as an outpatient.  Final Clinical Impressions(s) / ED Diagnoses   Final diagnoses:  Fall, initial encounter  Other closed displaced fracture of proximal end of right humerus, initial encounter    ED Discharge Orders        Ordered    HYDROcodone-acetaminophen (NORCO/VICODIN)  5-325 MG tablet  Every 6 hours PRN     07/30/17 1704       Davonna Belling, MD 07/30/17 1735

## 2017-07-30 NOTE — ED Notes (Signed)
Sling applied to pt. Pt verbalizes understanding of d/c instructions. Pt received prescriptions. Pt taken to lobby in wheelchair at d/c with all belongings.

## 2017-08-10 DIAGNOSIS — S42201A Unspecified fracture of upper end of right humerus, initial encounter for closed fracture: Secondary | ICD-10-CM | POA: Diagnosis present

## 2017-08-10 NOTE — H&P (Signed)
PREOPERATIVE H&P  Chief Complaint: RIGHT PROXIMAL HUMERUS FX  HPI: Christine Conley is a 84 y.o. female who presents for preoperative history and physical with a diagnosis of RIGHT PROXIMAL HUMERUS FX. Symptoms are rated as moderate to severe, and have been worsening.  This is significantly impairing activities of daily living.  She has elected for surgical management.   Past Medical History:  Diagnosis Date  . Acid reflux   . Anxiety   . Atrial fibrillation ([place])   . Hypertension    Past Surgical History:  Procedure Laterality Date  . KNEE SURGERY Left    Replacement  . OVARIAN CYST SURGERY     Social History   Socioeconomic History  . Marital status: Widowed    Spouse name: Not on file  . Number of children: 3  . Years of education: 16  . Highest education level: Not on file  Occupational History  . Occupation: Retired  Scientific laboratory technician  . Financial resource strain: Not on file  . Food insecurity:    Worry: Not on file    Inability: Not on file  . Transportation needs:    Medical: Not on file    Non-medical: Not on file  Tobacco Use  . Smoking status: Former Research scientist (life sciences)  . Smokeless tobacco: Never Used  Substance and Sexual Activity  . Alcohol use: No  . Drug use: No  . Sexual activity: Not on file  Lifestyle  . Physical activity:    Days per week: Not on file    Minutes per session: Not on file  . Stress: Not on file  Relationships  . Social connections:    Talks on phone: Not on file    Gets together: Not on file    Attends religious service: Not on file    Active member of club or organization: Not on file    Attends meetings of clubs or organizations: Not on file    Relationship status: Not on file  Other Topics Concern  . Not on file  Social History Narrative   Lives at home alone.   Right-handed.   Occasional use of caffeine.   Family History  Problem Relation Age of Onset  . Other Mother        died at 48 - old age  . Heart disease Father        died  at 2   Allergies  Allergen Reactions  . Aspirin     PVC's  . Codeine     hallucinations  . Meloxicam   . Penicillins     Localized mass, 'size of grapefruit'   Prior to Admission medications   Medication Sig Start Date End Date Taking? Authorizing Provider  amiodarone (PACERONE) 200 MG tablet Take 1 tablet (200 mg total) by mouth daily. 10/27/16   Gareth Morgan, MD  ciprofloxacin (CIPRO) 500 MG tablet Take 1 tablet (500 mg total) by mouth 2 (two) times daily. One po bid x 7 days 03/30/17   Julianne Rice, MD  furosemide (LASIX) 20 MG tablet Take 20 mg by mouth daily. 03/15/17   [provider]  HYDROcodone-acetaminophen (NORCO/VICODIN) 5-325 MG tablet Take 1 tablet by mouth every 6 (six) hours as needed. 07/30/17   Davonna Belling, MD  metoprolol succinate (TOPROL-XL) 50 MG 24 hr tablet Take 1 tablet (50 mg total) by mouth daily. 11/13/16   Jola Schmidt, MD  metroNIDAZOLE (FLAGYL) 500 MG tablet Take 1 tablet (500 mg total) by mouth 2 (two) times daily. One  po bid x 7 days 03/30/17   Julianne Rice, MD  pantoprazole (PROTONIX) 40 MG tablet Take 40 mg by mouth daily. 08/10/16   [provider]  Rivaroxaban (XARELTO) 15 MG TABS tablet Take 1 tablet (15 mg total) by mouth daily with supper. 10/27/16   Gareth Morgan, MD     Positive ROS: All other systems have been reviewed and were otherwise negative with the exception of those mentioned in the HPI and as above.  Physical Exam: General: Alert, no acute distress Cardiovascular: No pedal edema Respiratory: No cyanosis, no use of accessory musculature GI: No organomegaly, abdomen is soft and non-tender Skin: No lesions in the area of chief complaint Neurologic: Sensation intact distally Psychiatric: Patient is competent for consent with normal mood and affect Lymphatic: No axillary or cervical lymphadenopathy  MUSCULOSKELETAL: obvious scattered right sided ecchymosis, axillary nerve sensation preserved, ttp  around shoulder.  Assessment: RIGHT PROXIMAL HUMERUS FX  Plan: Plan for Procedure(s): RIGHT  OPEN REDUCTION INTERNAL FIXATION (ORIF) PROXIMAL HUMERUS  The risks benefits and alternatives were discussed with the patient including but not limited to the risks of nonoperative treatment, versus surgical intervention including infection, bleeding, nerve injury,  blood clots, cardiopulmonary complications, morbidity, mortality, among others, and they were willing to proceed.   Hiram Gash, MD  08/10/2017 3:56 PM

## 2017-08-11 NOTE — Pre-Procedure Instructions (Signed)
MATEYA TORTI  08/11/2017      [facility] [place], [address] [place] [address] Phone: [phone] Fax: [phone]  [facility] [place], [address] AT [facility] [address] [place] [address] Phone: [phone] Fax: [phone]    Your procedure is scheduled on 08/18/2017.  Report to [facility] Admitting at 1030 A.M.  Call this number if you have problems the morning of surgery:  [phone]   Remember:  Do not eat food or drink liquids after midnight.   Continue all medications as directed by your physician except follow these medication instructions before surgery below   Take these medicines the morning of surgery with A SIP OF WATER: Amiodarone (Pacerone) Hydrocodone-acetaminophen (Norco/vicodin) - if needed Metoprolol succinate (Toprol-XL) Pantoprazole (Protonix)  7 days prior to surgery STOP taking any Aspirin (unless otherwise instructed by your surgeon), Aleve, Naproxen, Ibuprofen, Motrin, Advil, Goody's, BC's, all herbal medications, fish oil, and all vitamins  Follow your doctors instructions regarding your Xarelto.  If no instructions were given by your doctor, then you will need to call the prescribing office office to get instructions.      Do not wear jewelry, make-up or nail polish.  Do not wear lotions, powders, or perfumes, or deodorant.  Do not shave 48 hours prior to surgery.    Do not bring valuables to the hospital.  [facility] is not responsible for any belongings or valuables.  Hearing aids, eyeglasses, contacts, dentures or bridgework may not be worn into surgery.  Leave your suitcase in the car.  After surgery it may be brought to your room.  For patients admitted to the hospital, discharge time will be determined by your treatment team.  Patients discharged the day of surgery  will not be allowed to drive home.   Name and phone number of your driver:    Special instructions:   [place]- Preparing For Surgery  Before surgery, you can play an important role. Because skin is not sterile, your skin needs to be as free of germs as possible. You can reduce the number of germs on your skin by washing with CHG (chlorahexidine gluconate) Soap before surgery.  CHG is an antiseptic cleaner which kills germs and bonds with the skin to continue killing germs even after washing.  Please do not use if you have an allergy to CHG or antibacterial soaps. If your skin becomes reddened/irritated stop using the CHG.  Do not shave (including legs and underarms) for at least 48 hours prior to first CHG shower. It is OK to shave your face.  Please follow these instructions carefully.   1. Shower the NIGHT BEFORE SURGERY and the MORNING OF SURGERY with CHG.   2. If you chose to wash your hair, wash your hair first as usual with your normal shampoo.  3. After you shampoo, rinse your hair and body thoroughly to remove the shampoo.  4. Use CHG as you would any other liquid soap. You can apply CHG directly to the skin and wash gently with a scrungie or a clean washcloth.   5. Apply the CHG Soap to your body ONLY FROM THE NECK DOWN.  Do not use on open wounds or open sores. Avoid contact with your eyes, ears, mouth and genitals (private parts). Wash Face and genitals (  private parts)  with your normal soap.  6. Wash thoroughly, paying special attention to the area where your surgery will be performed.  7. Thoroughly rinse your body with warm water from the neck down.  8. DO NOT shower/wash with your normal soap after using and rinsing off the CHG Soap.  9. Pat yourself dry with a CLEAN TOWEL.  10. Wear CLEAN PAJAMAS to bed the night before surgery, wear comfortable clothes the morning of surgery  11. Place CLEAN SHEETS on your bed the night of your first shower and DO NOT SLEEP WITH  PETS.    Day of Surgery: Do not apply any deodorants/lotions. Please wear clean clothes to the hospital/surgery center.      Please read over the following fact sheets that you were given.

## 2017-08-11 NOTE — H&P (Signed)
PREOPERATIVE H&P  Chief Complaint: RIGHT PROXIMAL HUMERUS FX  HPI: Christine Conley is a 84 y.o. female who presents for preoperative history and physical with a diagnosis of RIGHT PROXIMAL HUMERUS FX. Symptoms are rated as moderate to severe, and have been worsening.  This is significantly impairing activities of daily living.  She has elected for surgical management.   Past Medical History:  Diagnosis Date  . Acid reflux   . Anxiety   . Atrial fibrillation ([place])   . Hypertension    Past Surgical History:  Procedure Laterality Date  . KNEE SURGERY Left    Replacement  . OVARIAN CYST SURGERY     Social History   Socioeconomic History  . Marital status: Widowed    Spouse name: Not on file  . Number of children: 3  . Years of education: 22  . Highest education level: Not on file  Occupational History  . Occupation: Retired  Scientific laboratory technician  . Financial resource strain: Not on file  . Food insecurity:    Worry: Not on file    Inability: Not on file  . Transportation needs:    Medical: Not on file    Non-medical: Not on file  Tobacco Use  . Smoking status: Former Research scientist (life sciences)  . Smokeless tobacco: Never Used  Substance and Sexual Activity  . Alcohol use: No  . Drug use: No  . Sexual activity: Not on file  Lifestyle  . Physical activity:    Days per week: Not on file    Minutes per session: Not on file  . Stress: Not on file  Relationships  . Social connections:    Talks on phone: Not on file    Gets together: Not on file    Attends religious service: Not on file    Active member of club or organization: Not on file    Attends meetings of clubs or organizations: Not on file    Relationship status: Not on file  Other Topics Concern  . Not on file  Social History Narrative   Lives at home alone.   Right-handed.   Occasional use of caffeine.   Family History  Problem Relation Age of Onset  . Other Mother        died at 78 - old age  . Heart disease Father        died  at 87   Allergies  Allergen Reactions  . Aspirin     PVC's  . Codeine     hallucinations  . Meloxicam   . Penicillins     Localized mass, 'size of grapefruit' Has patient had a PCN reaction causing immediate rash, facial/tongue/throat swelling, SOB or lightheadedness with hypotension: Yes Has patient had a PCN reaction causing severe rash involving mucus membranes or skin necrosis: No Has patient had a PCN reaction that required hospitalization: No Has patient had a PCN reaction occurring within the last 10 years: No If all of the above answers are "NO", then may proceed with Cephalosporin use.  . Novocain [Procaine] Palpitations   Prior to Admission medications   Medication Sig Start Date End Date Taking? Authorizing Provider  acetaminophen (TYLENOL) 500 MG tablet Take 1,000 mg by mouth every 6 (six) hours as needed for moderate pain.   Yes [provider]  amiodarone (PACERONE) 200 MG tablet Take 1 tablet (200 mg total) by mouth daily. 10/27/16  Yes Gareth Morgan, MD  atorvastatin (LIPITOR) 10 MG tablet Take 10 mg by mouth daily. 07/03/17  Yes [provider]  furosemide (LASIX) 20 MG tablet Take 20 mg by mouth daily. 03/15/17  Yes [provider]  metoprolol succinate (TOPROL-XL) 50 MG 24 hr tablet Take 1 tablet (50 mg total) by mouth daily. 11/13/16  Yes Jola Schmidt, MD  pantoprazole (PROTONIX) 40 MG tablet Take 40 mg by mouth 2 (two) times daily.  08/10/16  Yes [provider]  PROCTO-MED HC 2.5 % rectal cream Place 1 application rectally 2 (two) times daily as needed. 07/07/17  Yes [provider]  Rivaroxaban (XARELTO) 15 MG TABS tablet Take 1 tablet (15 mg total) by mouth daily with supper. 10/27/16  Yes Gareth Morgan, MD  ciprofloxacin (CIPRO) 500 MG tablet Take 1 tablet (500 mg total) by mouth 2 (two) times daily. One po bid x 7 days Patient not taking: Reported on 08/11/2017 03/30/17   Julianne Rice, MD   HYDROcodone-acetaminophen (NORCO/VICODIN) 5-325 MG tablet Take 1 tablet by mouth every 6 (six) hours as needed. Patient not taking: Reported on 08/11/2017 07/30/17   Davonna Belling, MD  metroNIDAZOLE (FLAGYL) 500 MG tablet Take 1 tablet (500 mg total) by mouth 2 (two) times daily. One po bid x 7 days Patient not taking: Reported on 08/11/2017 03/30/17   Julianne Rice, MD     Positive ROS: All other systems have been reviewed and were otherwise negative with the exception of those mentioned in the HPI and as above.  Physical Exam: General: Alert, no acute distress Cardiovascular: No pedal edema Respiratory: No cyanosis, no use of accessory musculature GI: No organomegaly, abdomen is soft and non-tender Skin: No lesions in the area of chief complaint Neurologic: Sensation intact distally Psychiatric: Patient is competent for consent with normal mood and affect Lymphatic: No axillary or cervical lymphadenopathy  MUSCULOSKELETAL: R shoulder: scattered ecchymosis, axillary nerve firing, ttp about shoulder. NVID  Assessment: RIGHT PROXIMAL HUMERUS FX  Plan: Plan for Procedure(s): RIGHT  OPEN REDUCTION INTERNAL FIXATION (ORIF) PROXIMAL HUMERUS  The risks benefits and alternatives were discussed with the patient including but not limited to the risks of nonoperative treatment, versus surgical intervention including infection, bleeding, nerve injury,  blood clots, cardiopulmonary complications, morbidity, mortality, among others, and they were willing to proceed.   Hiram Gash, MD  08/11/2017 12:48 PM

## 2017-08-12 ENCOUNTER — Encounter (HOSPITAL_COMMUNITY): Payer: Self-pay

## 2017-08-12 ENCOUNTER — Other Ambulatory Visit: Payer: Self-pay

## 2017-08-12 ENCOUNTER — Ambulatory Visit: Payer: Medicare Other | Admitting: Neurology

## 2017-08-12 ENCOUNTER — Encounter (HOSPITAL_COMMUNITY)
Admission: RE | Admit: 2017-08-12 | Discharge: 2017-08-12 | Disposition: A | Discharge status: Home / Self Care | Payer: Medicare Other | Source: Ambulatory Visit | Attending: Orthopaedic Surgery | Admitting: Orthopaedic Surgery

## 2017-08-12 DIAGNOSIS — Z01812 Encounter for preprocedural laboratory examination: Secondary | ICD-10-CM | POA: Diagnosis present

## 2017-08-12 HISTORY — DX: Unspecified ovarian cyst, left side: N83.202

## 2017-08-12 HISTORY — DX: Other amnesia: R41.3

## 2017-08-12 HISTORY — DX: Unspecified ovarian cyst, right side: N83.201

## 2017-08-12 LAB — BASIC METABOLIC PANEL
ANION GAP: 12 (ref 5–15)
BUN: 26 mg/dL — ABNORMAL HIGH (ref 6–20)
CO2: 22 mmol/L (ref 22–32)
Calcium: 7.7 mg/dL — ABNORMAL LOW (ref 8.9–10.3)
Chloride: 108 mmol/L (ref 101–111)
Creatinine, Ser: 1.58 mg/dL — ABNORMAL HIGH (ref 0.44–1.00)
GFR, EST AFRICAN AMERICAN: 33 mL/min — AB (ref >60)
GFR, EST NON AFRICAN AMERICAN: 28 mL/min — AB (ref >60)
Glucose, Bld: 103 mg/dL — ABNORMAL HIGH (ref 65–99)
Potassium: 3.7 mmol/L (ref 3.5–5.1)
Sodium: 142 mmol/L (ref 135–145)

## 2017-08-12 LAB — CBC
HCT: 32.5 % — ABNORMAL LOW (ref 36.0–46.0)
HEMOGLOBIN: 10.2 g/dL — AB (ref 12.0–15.0)
MCH: 32 pg (ref 26.0–34.0)
MCHC: 31.4 g/dL (ref 30.0–36.0)
MCV: 101.9 fL — ABNORMAL HIGH (ref 78.0–100.0)
PLATELETS: 279 10*3/uL (ref 150–400)
RBC: 3.19 MIL/uL — AB (ref 3.87–5.11)
RDW: 14.2 % (ref 11.5–15.5)
WBC: 5.2 10*3/uL (ref 4.0–10.5)

## 2017-08-12 NOTE — Progress Notes (Addendum)
PCP - Anastasia Pall Cardiologist - Harwani  Chest x-ray - 09/23/16 EKG - 04/01/17 Stress Test - not sure  ECHO - not sure Cardiac Cath - not sure  Blood Thinner Instructions: Xarelto last dose 4/27  Anesthesia review: Yes patient has some memory loss daughter in law was unable to give complete cardiac history requesting records  Ginger Organ [phone]  Patient denies shortness of breath, fever, cough and chest pain at PAT appointment   Patient verbalized understanding of instructions that were given to them at the PAT appointment. Patient was also instructed that they will need to review over the PAT instructions again at home before surgery.

## 2017-08-13 NOTE — Progress Notes (Addendum)
Anesthesia Chart Review:  Case:  [id number] Date/Time:  08/18/17 1215   Procedure:  RIGHT  OPEN REDUCTION INTERNAL FIXATION (ORIF) PROXIMAL HUMERUS (Right )   Anesthesia type:  Choice   Pre-op diagnosis:  RIGHT PROXIMAL HUMERUS FX   Location:  [facility] OR ROOM 03 / [facility]   Surgeon:  Hiram Gash, MD      DISCUSSION: Patient is a 84 year old female scheduled for the above procedure. She fell on 07/30/17 and sustained a proximal right humerus fracture. History includes memory loss, HTN, PAF. Her labs trends in Epic and [facility] are consistent with renal insufficiency. Daughter-in-law accompanied patient to PAT. Patient's son is Yvone Neu [phone]). Patient sees Dr. Terrence Dupont for PAF history. Reportedly she was instructed to hold Xarelto starting 08/14/17.  She is for PT/INR on the day of surgery. Will check an ISTAT8 as well to recheck stability of anemia and renal function. Patient has been maintaining SR. If follow-up labs stable and otherwise no acute changes then I anticipate that she can proceed as planned.  VS: BP 127/66   Pulse 72   Temp 36.4 C   Resp 18   Ht 5\' 4"  (1.626 m)   Wt 175 lb (79.4 kg)   SpO2 97%   BMI 30.04 kg/m   PROVIDERS: [name], MD as PCP at [facility] (see Care Everywhere). Last visit 07/07/17.  Charolette Forward, MD as Cardiologist. Last visit 06/22/17 for PAT, HTN, HLD follow-up. No new testing ordered.   Marcial Pacas, MD is her Neurologist. Next visit scheduled for 08/17/17.   LABS: Preoperative labs reviewed. H/H 10.2/32.5, previously 11.4/35.0 on 03/30/17. PLT count 279. Glucose 103. BUN 26, Cr 1.58 (up from 24/1.30 on 03/30/17; previous Cr range of ~ 1.2-1.40 since 04/2016). A1c 5.1 on 07/07/17 ([place]). (all labs ordered are listed, but only abnormal results are displayed)  Labs Reviewed  BASIC METABOLIC PANEL - Abnormal; Notable for the following components:      Result Value   Glucose, Bld 103 (*)    BUN 26  (*)    Creatinine, Ser 1.58 (*)    Calcium 7.7 (*)    GFR calc non Af Amer 28 (*)    GFR calc Af Amer 33 (*)    All other components within normal limits  CBC - Abnormal; Notable for the following components:   RBC 3.19 (*)    Hemoglobin 10.2 (*)    HCT 32.5 (*)    MCV 101.9 (*)    All other components within normal limits    IMAGES: Xray right shoulder 07/30/17: IMPRESSION: Proximal right humerus fracture extends from the lateral aspect of the surgical neck into the proximal diaphysis as described above. Osteopenia.  CXR 11/13/16: IMPRESSION: No active cardiopulmonary disease.   EKG: 03/30/17: SR, probable old anteroseptal infarct.   CV: Echo 12/02/16 ([facility]): Summary: 2D echo and Doppler demonstrate normal left ventricular size.  Left ventricular systolic function is normal with normal ejection fraction.  Estimated EF 50-55%.  The left ventricle shows normal contractility and size.  Trace mitral regurgitation.  Trace tricuspid regurgitation with calculated RVSP 28 mmHg.  No intracardiac shunting is found.  No intracardiac masses or thrombi are seen.  No pericardial effusion is seen with no suggestion of pericardial tamponade.  Past Medical History:  Diagnosis Date  . Acid reflux   . Anxiety   . Atrial fibrillation ([place])   . Bilateral ovarian cysts   .  Hypertension   . Memory loss    patient has been tested for dementia but was not diagnosed    Past Surgical History:  Procedure Laterality Date  . COLONOSCOPY    . ESOPHAGOGASTRODUODENOSCOPY    . EYE SURGERY     "to fix cross eyed"  . KNEE SURGERY Left    Replacement  . OVARIAN CYST SURGERY      MEDICATIONS: . acetaminophen (TYLENOL) 500 MG tablet  . amiodarone (PACERONE) 200 MG tablet  . atorvastatin (LIPITOR) 10 MG tablet  . ciprofloxacin (CIPRO) 500 MG tablet  . furosemide (LASIX) 20 MG tablet  . HYDROcodone-acetaminophen (NORCO/VICODIN) 5-325 MG tablet  . metoprolol succinate (TOPROL-XL) 50  MG 24 hr tablet  . metroNIDAZOLE (FLAGYL) 500 MG tablet  . pantoprazole (PROTONIX) 40 MG tablet  . PROCTO-MED HC 2.5 % rectal cream  . Rivaroxaban (XARELTO) 15 MG TABS tablet   No current facility-administered medications for this encounter.    [facility] Short Stay Center/Anesthesiology Phone [phone] 08/13/2017 3:35 PM

## 2017-08-17 ENCOUNTER — Ambulatory Visit: Payer: Medicare Other | Admitting: Neurology

## 2017-08-17 ENCOUNTER — Encounter: Payer: Self-pay | Admitting: Neurology

## 2017-08-17 ENCOUNTER — Telehealth: Payer: Self-pay | Admitting: *Deleted

## 2017-08-17 NOTE — Telephone Encounter (Signed)
No showed follow up appointment today.

## 2017-08-18 ENCOUNTER — Ambulatory Visit (HOSPITAL_COMMUNITY): Payer: Medicare Other

## 2017-08-18 ENCOUNTER — Inpatient Hospital Stay (HOSPITAL_COMMUNITY)
Admission: RE | Admit: 2017-08-18 | Discharge: 2017-08-19 | DRG: 494 | Disposition: A | Discharge status: Home / Self Care | Payer: Medicare Other | Source: Ambulatory Visit | Attending: Orthopaedic Surgery | Admitting: Orthopaedic Surgery

## 2017-08-18 ENCOUNTER — Inpatient Hospital Stay (HOSPITAL_COMMUNITY): Payer: Medicare Other

## 2017-08-18 ENCOUNTER — Encounter (HOSPITAL_COMMUNITY): Payer: Self-pay | Admitting: *Deleted

## 2017-08-18 ENCOUNTER — Ambulatory Visit (HOSPITAL_COMMUNITY): Payer: Medicare Other | Admitting: Vascular Surgery

## 2017-08-18 ENCOUNTER — Encounter (HOSPITAL_COMMUNITY)
Admission: RE | Disposition: A | Discharge status: Home / Self Care | Payer: Self-pay | Source: Ambulatory Visit | Attending: Orthopaedic Surgery

## 2017-08-18 DIAGNOSIS — Z885 Allergy status to narcotic agent status: Secondary | ICD-10-CM

## 2017-08-18 DIAGNOSIS — S42201A Unspecified fracture of upper end of right humerus, initial encounter for closed fracture: Secondary | ICD-10-CM | POA: Diagnosis present

## 2017-08-18 DIAGNOSIS — Z886 Allergy status to analgesic agent status: Secondary | ICD-10-CM | POA: Diagnosis not present

## 2017-08-18 DIAGNOSIS — I10 Essential (primary) hypertension: Secondary | ICD-10-CM | POA: Diagnosis present

## 2017-08-18 DIAGNOSIS — Z87890 Personal history of sex reassignment: Secondary | ICD-10-CM | POA: Diagnosis not present

## 2017-08-18 DIAGNOSIS — Z884 Allergy status to anesthetic agent status: Secondary | ICD-10-CM

## 2017-08-18 DIAGNOSIS — Z96652 Presence of left artificial knee joint: Secondary | ICD-10-CM | POA: Diagnosis not present

## 2017-08-18 DIAGNOSIS — W19XXXA Unspecified fall, initial encounter: Secondary | ICD-10-CM | POA: Diagnosis present

## 2017-08-18 DIAGNOSIS — K219 Gastro-esophageal reflux disease without esophagitis: Secondary | ICD-10-CM | POA: Diagnosis not present

## 2017-08-18 DIAGNOSIS — Z79899 Other long term (current) drug therapy: Secondary | ICD-10-CM | POA: Diagnosis not present

## 2017-08-18 DIAGNOSIS — F419 Anxiety disorder, unspecified: Secondary | ICD-10-CM | POA: Diagnosis not present

## 2017-08-18 DIAGNOSIS — R0789 Other chest pain: Secondary | ICD-10-CM | POA: Diagnosis not present

## 2017-08-18 DIAGNOSIS — Z7901 Long term (current) use of anticoagulants: Secondary | ICD-10-CM

## 2017-08-18 DIAGNOSIS — Z888 Allergy status to other drugs, medicaments and biological substances status: Secondary | ICD-10-CM | POA: Diagnosis not present

## 2017-08-18 DIAGNOSIS — I4891 Unspecified atrial fibrillation: Secondary | ICD-10-CM | POA: Diagnosis present

## 2017-08-18 DIAGNOSIS — I480 Paroxysmal atrial fibrillation: Secondary | ICD-10-CM | POA: Diagnosis not present

## 2017-08-18 DIAGNOSIS — Z419 Encounter for procedure for purposes other than remedying health state, unspecified: Secondary | ICD-10-CM

## 2017-08-18 DIAGNOSIS — Z7902 Long term (current) use of antithrombotics/antiplatelets: Secondary | ICD-10-CM | POA: Diagnosis not present

## 2017-08-18 DIAGNOSIS — Z09 Encounter for follow-up examination after completed treatment for conditions other than malignant neoplasm: Secondary | ICD-10-CM

## 2017-08-18 DIAGNOSIS — Z66 Do not resuscitate: Secondary | ICD-10-CM | POA: Diagnosis not present

## 2017-08-18 DIAGNOSIS — E785 Hyperlipidemia, unspecified: Secondary | ICD-10-CM | POA: Diagnosis not present

## 2017-08-18 HISTORY — PX: BILATERAL OPEN REDUCTION INTERNAL FIXATION (ORIF) PROXIMAL HUMERUS: SHX6440

## 2017-08-18 LAB — POCT I-STAT 4, (NA,K, GLUC, HGB,HCT)
GLUCOSE: 105 mg/dL — AB (ref 65–99)
HEMATOCRIT: 30 % — AB (ref 36.0–46.0)
Hemoglobin: 10.2 g/dL — ABNORMAL LOW (ref 12.0–15.0)
Potassium: 3.8 mmol/L (ref 3.5–5.1)
Sodium: 142 mmol/L (ref 135–145)

## 2017-08-18 LAB — PROTIME-INR
INR: 1.24
Prothrombin Time: 15.5 seconds — ABNORMAL HIGH (ref 11.4–15.2)

## 2017-08-18 SURGERY — BILATERAL OPEN REDUCTION INTERNAL FIXATION (ORIF) PROXIMAL HUMERUS
Anesthesia: Regional | Laterality: Right

## 2017-08-18 MED ORDER — EPHEDRINE SULFATE-NACL 50-0.9 MG/10ML-% IV SOSY
PREFILLED_SYRINGE | INTRAVENOUS | Status: DC | PRN
  Administered 2017-08-18: 10 mg via INTRAVENOUS
  Administered 2017-08-18: 5 mg via INTRAVENOUS

## 2017-08-18 MED ORDER — RIVAROXABAN 15 MG PO TABS
15.0000 mg | ORAL_TABLET | Freq: Every day | ORAL | Status: DC
  Filled 2017-08-18: qty 1

## 2017-08-18 MED ORDER — ARTIFICIAL TEARS OPHTHALMIC OINT
TOPICAL_OINTMENT | OPHTHALMIC | Status: DC | PRN
  Administered 2017-08-18: 1 via OPHTHALMIC

## 2017-08-18 MED ORDER — SUGAMMADEX SODIUM 200 MG/2ML IV SOLN
INTRAVENOUS | Status: AC
  Filled 2017-08-18: qty 2

## 2017-08-18 MED ORDER — AMIODARONE HCL 100 MG PO TABS
200.0000 mg | ORAL_TABLET | Freq: Every day | ORAL | Status: DC
  Administered 2017-08-19: 200 mg via ORAL
  Filled 2017-08-18: qty 2

## 2017-08-18 MED ORDER — ONDANSETRON HCL 4 MG PO TABS: 4.0000 mg | ORAL_TABLET | Freq: Four times a day (QID) | ORAL | Status: DC | PRN

## 2017-08-18 MED ORDER — EPHEDRINE 5 MG/ML INJ
INTRAVENOUS | Status: AC
  Filled 2017-08-18: qty 10

## 2017-08-18 MED ORDER — LIDOCAINE 2% (20 MG/ML) 5 ML SYRINGE
INTRAMUSCULAR | Status: DC | PRN
  Administered 2017-08-18: 20 mg via INTRAVENOUS

## 2017-08-18 MED ORDER — LACTATED RINGERS IV SOLN
INTRAVENOUS | Status: DC | PRN
  Administered 2017-08-18 (×2): via INTRAVENOUS

## 2017-08-18 MED ORDER — MIDAZOLAM HCL 2 MG/2ML IJ SOLN
INTRAMUSCULAR | Status: AC
  Filled 2017-08-18: qty 2

## 2017-08-18 MED ORDER — ZOLPIDEM TARTRATE 5 MG PO TABS: 5.0000 mg | ORAL_TABLET | Freq: Every evening | ORAL | Status: DC | PRN

## 2017-08-18 MED ORDER — SUGAMMADEX SODIUM 200 MG/2ML IV SOLN
INTRAVENOUS | Status: DC | PRN
  Administered 2017-08-18: 200 mg via INTRAVENOUS

## 2017-08-18 MED ORDER — OXYCODONE HCL 5 MG PO TABS: 5.0000 mg | ORAL_TABLET | ORAL | Status: DC | PRN

## 2017-08-18 MED ORDER — CLINDAMYCIN PHOSPHATE 600 MG/50ML IV SOLN
600.0000 mg | Freq: Four times a day (QID) | INTRAVENOUS | Status: AC
  Administered 2017-08-18 – 2017-08-19 (×3): 600 mg via INTRAVENOUS
  Filled 2017-08-18 (×3): qty 50

## 2017-08-18 MED ORDER — HYDROMORPHONE HCL 2 MG/ML IJ SOLN: 0.5000 mg | INTRAMUSCULAR | Status: DC | PRN

## 2017-08-18 MED ORDER — ATORVASTATIN CALCIUM 10 MG PO TABS
10.0000 mg | ORAL_TABLET | Freq: Every day | ORAL | Status: DC
  Administered 2017-08-18: 10 mg via ORAL
  Filled 2017-08-18: qty 1

## 2017-08-18 MED ORDER — FENTANYL CITRATE (PF) 100 MCG/2ML IJ SOLN
INTRAMUSCULAR | Status: DC | PRN
  Administered 2017-08-18 (×2): 50 ug via INTRAVENOUS
  Administered 2017-08-18: 25 ug via INTRAVENOUS

## 2017-08-18 MED ORDER — 0.9 % SODIUM CHLORIDE (POUR BTL) OPTIME
TOPICAL | Status: DC | PRN
  Administered 2017-08-18: 1000 mL

## 2017-08-18 MED ORDER — FENTANYL CITRATE (PF) 250 MCG/5ML IJ SOLN
INTRAMUSCULAR | Status: AC
  Filled 2017-08-18: qty 5

## 2017-08-18 MED ORDER — BUPIVACAINE-EPINEPHRINE (PF) 0.5% -1:200000 IJ SOLN
INTRAMUSCULAR | Status: DC | PRN
  Administered 2017-08-18: 20 mL via PERINEURAL

## 2017-08-18 MED ORDER — PANTOPRAZOLE SODIUM 40 MG PO TBEC
40.0000 mg | DELAYED_RELEASE_TABLET | Freq: Two times a day (BID) | ORAL | Status: DC
  Administered 2017-08-18 – 2017-08-19 (×2): 40 mg via ORAL
  Filled 2017-08-18 (×2): qty 1

## 2017-08-18 MED ORDER — PROPOFOL 10 MG/ML IV BOLUS
INTRAVENOUS | Status: AC
  Filled 2017-08-18: qty 20

## 2017-08-18 MED ORDER — ONDANSETRON HCL 4 MG/2ML IJ SOLN: 4.0000 mg | Freq: Four times a day (QID) | INTRAMUSCULAR | Status: DC | PRN

## 2017-08-18 MED ORDER — DEXAMETHASONE SODIUM PHOSPHATE 4 MG/ML IJ SOLN
INTRAMUSCULAR | Status: DC | PRN
  Administered 2017-08-18: 8 mg via INTRAVENOUS

## 2017-08-18 MED ORDER — PHENYLEPHRINE 40 MCG/ML (10ML) SYRINGE FOR IV PUSH (FOR BLOOD PRESSURE SUPPORT)
PREFILLED_SYRINGE | INTRAVENOUS | Status: AC
  Filled 2017-08-18: qty 10

## 2017-08-18 MED ORDER — METOPROLOL SUCCINATE ER 50 MG PO TB24
50.0000 mg | ORAL_TABLET | Freq: Every day | ORAL | Status: DC
  Administered 2017-08-19: 50 mg via ORAL
  Filled 2017-08-18: qty 1

## 2017-08-18 MED ORDER — ACETAMINOPHEN 500 MG PO TABS
1000.0000 mg | ORAL_TABLET | Freq: Four times a day (QID) | ORAL | Status: AC
  Administered 2017-08-18 – 2017-08-19 (×4): 1000 mg via ORAL
  Filled 2017-08-18 (×4): qty 2

## 2017-08-18 MED ORDER — CLINDAMYCIN PHOSPHATE 900 MG/50ML IV SOLN
900.0000 mg | INTRAVENOUS | Status: AC
  Administered 2017-08-18: 900 mg via INTRAVENOUS
  Filled 2017-08-18: qty 50

## 2017-08-18 MED ORDER — ROCURONIUM BROMIDE 100 MG/10ML IV SOLN
INTRAVENOUS | Status: DC | PRN
  Administered 2017-08-18: 40 mg via INTRAVENOUS

## 2017-08-18 MED ORDER — ARTIFICIAL TEARS OPHTHALMIC OINT
TOPICAL_OINTMENT | OPHTHALMIC | Status: AC
  Filled 2017-08-18: qty 3.5

## 2017-08-18 MED ORDER — LACTATED RINGERS IV SOLN
INTRAVENOUS | Status: DC
  Administered 2017-08-18: 11:00:00 via INTRAVENOUS

## 2017-08-18 MED ORDER — ONDANSETRON HCL 4 MG/2ML IJ SOLN
INTRAMUSCULAR | Status: AC
  Filled 2017-08-18: qty 2

## 2017-08-18 MED ORDER — PROPOFOL 10 MG/ML IV BOLUS
INTRAVENOUS | Status: DC | PRN
  Administered 2017-08-18: 120 mg via INTRAVENOUS

## 2017-08-18 MED ORDER — VANCOMYCIN HCL 1000 MG IV SOLR
INTRAVENOUS | Status: AC
  Filled 2017-08-18: qty 1000

## 2017-08-18 MED ORDER — DEXAMETHASONE SODIUM PHOSPHATE 10 MG/ML IJ SOLN
INTRAMUSCULAR | Status: AC
  Filled 2017-08-18: qty 1

## 2017-08-18 MED ORDER — FUROSEMIDE 20 MG PO TABS
20.0000 mg | ORAL_TABLET | Freq: Every day | ORAL | Status: DC
  Administered 2017-08-19: 20 mg via ORAL
  Filled 2017-08-18: qty 1

## 2017-08-18 MED ORDER — PHENYLEPHRINE HCL 10 MG/ML IJ SOLN
INTRAVENOUS | Status: DC | PRN
  Administered 2017-08-18: 60 ug/min via INTRAVENOUS

## 2017-08-18 MED ORDER — DOCUSATE SODIUM 100 MG PO CAPS
100.0000 mg | ORAL_CAPSULE | Freq: Two times a day (BID) | ORAL | Status: DC
  Administered 2017-08-18: 100 mg via ORAL
  Filled 2017-08-18 (×2): qty 1

## 2017-08-18 MED ORDER — ROCURONIUM BROMIDE 10 MG/ML (PF) SYRINGE
PREFILLED_SYRINGE | INTRAVENOUS | Status: AC
  Filled 2017-08-18: qty 5

## 2017-08-18 MED ORDER — DIPHENHYDRAMINE HCL 12.5 MG/5ML PO ELIX: 12.5000 mg | ORAL_SOLUTION | ORAL | Status: DC | PRN

## 2017-08-18 MED ORDER — FENTANYL CITRATE (PF) 100 MCG/2ML IJ SOLN
INTRAMUSCULAR | Status: AC
  Filled 2017-08-18: qty 2

## 2017-08-18 MED ORDER — ONDANSETRON HCL 4 MG/2ML IJ SOLN
INTRAMUSCULAR | Status: DC | PRN
  Administered 2017-08-18: 4 mg via INTRAVENOUS

## 2017-08-18 MED ORDER — LIDOCAINE 2% (20 MG/ML) 5 ML SYRINGE
INTRAMUSCULAR | Status: AC
  Filled 2017-08-18: qty 5

## 2017-08-18 MED ORDER — CHLORHEXIDINE GLUCONATE 4 % EX LIQD: 60.0000 mL | Freq: Once | CUTANEOUS | Status: DC

## 2017-08-18 MED ORDER — PHENYLEPHRINE 40 MCG/ML (10ML) SYRINGE FOR IV PUSH (FOR BLOOD PRESSURE SUPPORT)
PREFILLED_SYRINGE | INTRAVENOUS | Status: DC | PRN
  Administered 2017-08-18: 40 ug via INTRAVENOUS
  Administered 2017-08-18 (×2): 80 ug via INTRAVENOUS

## 2017-08-18 SURGICAL SUPPLY — 72 items
AID PSTN UNV HD RSTRNT DISP (MISCELLANEOUS) ×1
BIT DRILL 3.2 (BIT) ×3
BIT DRILL 3.2XCALB NS DISP (BIT) IMPLANT
BIT DRILL CALIBRATED 2.7 (BIT) ×1 IMPLANT
BIT DRILL CALIBRATED 2.7MM (BIT) ×1
BIT DRL 3.2XCALB NS DISP (BIT) ×1
BONE CANC CHIPS 20CC PCAN1/4 (Bone Implant) ×3 IMPLANT
CHIPS CANC BONE 20CC PCAN1/4 (Bone Implant) ×1 IMPLANT
CHLORAPREP W/TINT 26ML (MISCELLANEOUS) ×5 IMPLANT
CLOSURE WOUND 1/2 X4 (GAUZE/BANDAGES/DRESSINGS) ×1
COVER SURGICAL LIGHT HANDLE (MISCELLANEOUS) ×6 IMPLANT
DRAPE C-ARM 42X72 X-RAY (DRAPES) ×3 IMPLANT
DRAPE HALF SHEET 40X57 (DRAPES) ×3 IMPLANT
DRAPE IMP U-DRAPE 54X76 (DRAPES) ×3 IMPLANT
DRAPE INCISE IOBAN 66X45 STRL (DRAPES) ×3 IMPLANT
DRAPE ORTHO SPLIT 77X108 STRL (DRAPES) ×6
DRAPE SURG 17X23 STRL (DRAPES) ×3 IMPLANT
DRAPE SURG ORHT 6 SPLT 77X108 (DRAPES) ×2 IMPLANT
DRAPE U-SHAPE 47X51 STRL (DRAPES) ×3 IMPLANT
DRSG AQUACEL AG ADV 3.5X 6 (GAUZE/BANDAGES/DRESSINGS) ×2 IMPLANT
DRSG MEPILEX BORDER 4X8 (GAUZE/BANDAGES/DRESSINGS) ×2 IMPLANT
ELECT BLADE 4.0 EZ CLEAN MEGAD (MISCELLANEOUS)
ELECT REM PT RETURN 9FT ADLT (ELECTROSURGICAL) ×3
ELECTRODE BLDE 4.0 EZ CLN MEGD (MISCELLANEOUS) IMPLANT
ELECTRODE REM PT RTRN 9FT ADLT (ELECTROSURGICAL) ×1 IMPLANT
GAUZE SPONGE 4X4 12PLY STRL (GAUZE/BANDAGES/DRESSINGS) IMPLANT
GLOVE BIOGEL PI IND STRL 8 (GLOVE) ×1 IMPLANT
GLOVE BIOGEL PI INDICATOR 8 (GLOVE) ×2
GLOVE ECLIPSE 8.0 STRL XLNG CF (GLOVE) ×3 IMPLANT
GOWN STRL REUS W/ TWL LRG LVL3 (GOWN DISPOSABLE) ×3 IMPLANT
GOWN STRL REUS W/ TWL XL LVL3 (GOWN DISPOSABLE) ×1 IMPLANT
GOWN STRL REUS W/TWL LRG LVL3 (GOWN DISPOSABLE) ×6
GOWN STRL REUS W/TWL XL LVL3 (GOWN DISPOSABLE) ×3
GRAFT BNE CANC CHIPS 1-8 20CC (Bone Implant) IMPLANT
K-WIRE 2X5 SS THRDED S3 (WIRE) ×18
KIT BASIN OR (CUSTOM PROCEDURE TRAY) ×3 IMPLANT
KIT TURNOVER KIT B (KITS) ×3 IMPLANT
KWIRE 2X5 SS THRDED S3 (WIRE) IMPLANT
MANIFOLD NEPTUNE II (INSTRUMENTS) ×3 IMPLANT
NDL SUT .5 MAYO 1.404X.05X (NEEDLE) ×1 IMPLANT
NDL SUT 2 .5 CRC MAYO 1.732X (NEEDLE) ×1 IMPLANT
NEEDLE 22X1 1/2 (OR ONLY) (NEEDLE) IMPLANT
NEEDLE MAYO TAPER (NEEDLE) ×6
NS IRRIG 1000ML POUR BTL (IV SOLUTION) ×3 IMPLANT
PACK SHOULDER (CUSTOM PROCEDURE TRAY) ×3 IMPLANT
PAD ARMBOARD 7.5X6 YLW CONV (MISCELLANEOUS) ×6 IMPLANT
PEG LOCKING 3.2MMX44 (Peg) ×4 IMPLANT
PEG LOCKING 3.2MMX46 (Peg) ×4 IMPLANT
PEG LOCKING 3.2X32 (Peg) ×2 IMPLANT
PEG LOCKING 3.2X36 (Screw) ×4 IMPLANT
PLATE PROX HUM LO R 4H 83 (Plate) ×2 IMPLANT
RESTRAINT HEAD UNIVERSAL NS (MISCELLANEOUS) ×3 IMPLANT
SCREW LOCK CORT STAR 3.5X28 (Screw) ×2 IMPLANT
SCREW LOW PROF TIS 3.5X28MM (Screw) ×2 IMPLANT
SCREW LOW PROFILE 3.5X30MM TIS (Screw) ×2 IMPLANT
SLEEVE MEASURING 3.2 (BIT) ×2 IMPLANT
SLING ARM FOAM STRAP XLG (SOFTGOODS) ×2 IMPLANT
SPONGE LAP 18X18 X RAY DECT (DISPOSABLE) ×6 IMPLANT
STAPLER VISISTAT 35W (STAPLE) ×3 IMPLANT
STRIP CLOSURE SKIN 1/2X4 (GAUZE/BANDAGES/DRESSINGS) ×2 IMPLANT
SUCTION FRAZIER HANDLE 10FR (MISCELLANEOUS) ×2
SUCTION TUBE FRAZIER 10FR DISP (MISCELLANEOUS) ×1 IMPLANT
SUT MAXBRAID (SUTURE) ×6 IMPLANT
SUT VIC AB 2-0 CT1 27 (SUTURE) ×6
SUT VIC AB 2-0 CT1 TAPERPNT 27 (SUTURE) ×2 IMPLANT
SYR CONTROL 10ML LL (SYRINGE) IMPLANT
TOWEL OR 17X24 6PK STRL BLUE (TOWEL DISPOSABLE) ×3 IMPLANT
TOWEL OR 17X26 10 PK STRL BLUE (TOWEL DISPOSABLE) ×3 IMPLANT
TUBE CONNECTING 12'X1/4 (SUCTIONS) ×1
TUBE CONNECTING 12X1/4 (SUCTIONS) ×2 IMPLANT
WATER STERILE IRR 1000ML POUR (IV SOLUTION) ×3 IMPLANT
YANKAUER SUCT BULB TIP NO VENT (SUCTIONS) ×3 IMPLANT

## 2017-08-18 NOTE — Interval H&P Note (Signed)
Discussed case, risks and benefits with patient again.  All questions answered, no change to history.  Kdyn Vonbehren MD  

## 2017-08-18 NOTE — Op Note (Signed)
Orthopaedic Surgery Operative Note (CSN: [id number])  Christine Conley  Jun 23, 1928 Date of Surgery: 08/18/2017   Diagnoses:  Right two-part proximal humerus fracture.  Procedure: Open reduction internal fixation right proximal humerus fracture with allograft bone grafting 23615   Operative Finding Successful completion of planned procedure.  In interim from diagnosis to surgery the patient had eroded significant posterior cancellus bone out of the head.  Bone grafting was successful with 20 cc of chips.  A good fixation of the fracture.  Around the world views demonstrated no perforation of screws through the head.  Post-operative plan: The patient will be nonweightbearing in a sling until 4 weeks and start therapy at that point.  The patient will be admitted overnight.  DVT prophylaxis baseline Xarelto.  Pain control with PRN pain medication preferring oral medicines.  Follow up plan will be scheduled in approximately 7 days for incision check and XR AP only.  Post-Op Diagnosis: Same Surgeons:Primary: Hiram Gash, MD Assistants: Joya Gaskins OPA Location: [facility] OR ROOM 03 Anesthesia: Choice Antibiotics: Ancef 2g preop, Vancomycin 1000mg  locally  Tourniquet time: * No tourniquets in log * Estimated Blood Loss: 277 Complications: None Specimens: None Implants: Implant Name Type Inv. Item Serial No. Manufacturer Lot No. LRB No. Used Action  BONE CANC CHIPS 20CC - [id number] Bone Implant BONE CANC CHIPS 20CC [id number] [employer]  Right 1 Implanted  PLATE PROX HUM LO R 4H 83 - [id number] Plate PLATE PROX HUM LO R 4H 15  [employer](ORTH,TRAU,BIO,SG)  Right 1 Implanted  SCREW LOW PROF TIS 3.5X28MM - [id number] Screw SCREW LOW PROF TIS 3.5X28MM  [employer](ORTH,TRAU,BIO,SG)  Right 1 Implanted  SCREW LOW PROFILE 3.5X30MM TIS - [id number] Screw SCREW LOW PROFILE 3.5X30MM TIS  [employer](ORTH,TRAU,BIO,SG)  Right 1 Implanted  SCREW CORT LOCK 3.5MM 28MM - [id number] Screw SCREW  CORT LOCK 3.5MM 28MM  [employer](ORTH,TRAU,BIO,SG)  Right 1 Implanted  PEG LOCKING 3.2X36 - [id number] Screw PEG LOCKING 3.2X36  [employer](ORTH,TRAU,BIO,SG)  Right 1 Implanted  PEG LOCKING 3.2MMX44 - [id number] Peg PEG LOCKING 3.2MMX44  [employer](ORTH,TRAU,BIO,SG)  Right 2 Implanted  PEG LOCKING 3.2MMX46 - [id number] Peg PEG LOCKING 3.2MMX46  [employer](ORTH,TRAU,BIO,SG)  Right 2 Implanted  PEG LOCKING 3.2X32 - [id number] Peg PEG LOCKING 3.2X32  [employer](ORTH,TRAU,BIO,SG)  Right 1 Implanted    Indications for Surgery:   ABAGAYLE KLUTTS is a 84 y.o. female with fall resulting in initially nondisplaced proximal humerus fracture.  On secondary x-rays 1 week out from her injury she had significant anteriorization of the shaft and displacement of the fracture worrisome for potential for malunion and nonunion.  She had altered but intact axillary nerve function prior to surgery.  Benefits and risks of operative and nonoperative management were discussed prior to surgery with patient/guardian(s) and informed consent form was completed.  Specific risks including infection, need for additional surgery, screw cut out and varus collapse, AVN.   Procedure:   The patient was identified in the preoperative holding area where the surgical site was marked. The patient was taken to the OR where a procedural timeout was called and the above noted anesthesia was induced.  The patient was positioned beachchair.  Preoperative antibiotics were dosed.  The patient's right shoulder was prepped and draped in the usual sterile fashion.  A second preoperative timeout was called.      Standard deltopectoral approach was performed with a #10 blade. We dissected down to the subcutaneous tissues and the cephalic vein was taken laterally  with the deltoid. Clavipectoral fascia was incised in line with the incision. Deep retractors were placed.   This point we identified the fracture fragments.  Used fluoroscopic imaging to  identify that there was actually a segmental portion of the humerus just distal to the surgical neck.  We then mobilized the fracture fragments and used fluoroscopy as well as K wires to hold a provisional reduction.  There is significant posterior superior bone loss out of the cancellus portion of head.  It was determined that we would need to do nonstructural cancellus grafting to fill this defect.  We considered fibular strut but based on the patient's age and the nature of her fracture without we can have good support without the need of this augment and additionally if she did fail to make further reconstruction much more difficult.  Once we had an appropriate reduction we proceeded to use fluoroscopy and select the appropriate size Biomet plate.  We then placed the plate and once we provisionally fixed it with a K wire we checked our position.  We then placed a shaft screw pulling the plate down to bone.  At that point we transitioned our attention to the proximal pegs and placed the anterior and central pegs achieving good purchase without penetrating the articular surface.  We then used allograft cancellus chips and bone grafted the posterior aspect of the head using a bone tamp.  Once this was performed we filled the posterior pegs of the proximal plate.  Shaft fixation was completed with an additional locking and nonlocking screw.  Final fluoroscopic images and around the world live fluoroscopy views demonstrated that all screws were outside of the articular surface that in appropriate alignment was achieved.  Incision was irrigated copiously and mycin powder was placed within the incision.  Deltopectoral interval was closed with nonabsorbable suture and a loosely approximated fashion.  The wounds were cleaned and dried and an Aquacel dressing was placed. The drapes taken down. The arm was placed into sling with abduction pillow. Patient was awakened, extubated, and transferred to the recovery room  in stable condition. There were no intraoperative complications. The sponge, needle, and attention counts were  correct at the end of the case.    Joya Gaskins, OPA-C, present and scrubbed throughout the case, critical for completion in a timely fashion, and for retraction, instrumentation, closure.

## 2017-08-18 NOTE — Anesthesia Procedure Notes (Signed)
Procedure Name: Intubation Date/Time: 08/18/2017 1:20 PM Performed by: Orlie Dakin, CRNA Pre-anesthesia Checklist: Patient identified, Emergency Drugs available, Suction available, Patient being monitored and Timeout performed Patient Re-evaluated:Patient Re-evaluated prior to induction Oxygen Delivery Method: Circle system utilized Preoxygenation: Pre-oxygenation with 100% oxygen Induction Type: IV induction Ventilation: Mask ventilation without difficulty Laryngoscope Size: Miller and 3 Grade View: Grade I Tube type: Oral Tube size: 7.0 mm Number of attempts: 1 Airway Equipment and Method: Stylet Placement Confirmation: ETT inserted through vocal cords under direct vision,  positive ETCO2 and breath sounds checked- equal and bilateral Secured at: 22 cm Tube secured with: Tape Dental Injury: Teeth and Oropharynx as per pre-operative assessment  Comments: Pre-op noted many upper front caps, none loose per patient report.  4x4s bite block used.

## 2017-08-18 NOTE — Anesthesia Preprocedure Evaluation (Addendum)
Anesthesia Evaluation  Patient identified by MRN, date of birth, ID band Patient awake    Reviewed: Allergy & Precautions, NPO status , Patient's Chart, lab work & pertinent test results  Airway Mallampati: II  TM Distance: >3 FB Neck ROM: Full    Dental  (+) Teeth Intact, Dental Advisory Given   Pulmonary former smoker,    breath sounds clear to auscultation       Cardiovascular hypertension, Pt. on medications and Pt. on home beta blockers + dysrhythmias Atrial Fibrillation  Rhythm:Regular Rate:Normal + Systolic murmurs    Neuro/Psych Anxiety    GI/Hepatic Neg liver ROS, GERD  Medicated,  Endo/Other    Renal/GU negative Renal ROS     Musculoskeletal negative musculoskeletal ROS (+)   Abdominal Normal abdominal exam  (+)   Peds  Hematology negative hematology ROS (+)   Anesthesia Other Findings   Reproductive/Obstetrics                           Anesthesia Physical Anesthesia Plan  ASA: III  Anesthesia Plan: General   Post-op Pain Management: GA combined w/ Regional for post-op pain   Induction: Intravenous  PONV Risk Score and Plan: Ondansetron, Dexamethasone and Treatment may vary due to age or medical condition  Airway Management Planned: Oral ETT  Additional Equipment: None  Intra-op Plan:   Post-operative Plan: Extubation in OR  Informed Consent: I have reviewed the patients History and Physical, chart, labs and discussed the procedure including the risks, benefits and alternatives for the proposed anesthesia with the patient or authorized representative who has indicated his/her understanding and acceptance.   Dental advisory given  Plan Discussed with: CRNA  Anesthesia Plan Comments:        Anesthesia Quick Evaluation

## 2017-08-18 NOTE — Transfer of Care (Signed)
Immediate Anesthesia Transfer of Care Note  Patient: Christine Conley  Procedure(s) Performed: RIGHT  OPEN REDUCTION INTERNAL FIXATION (ORIF) PROXIMAL HUMERUS (Right )  Patient Location: PACU  Anesthesia Type:GA combined with regional for post-op pain  Level of Consciousness: awake and alert   Airway & Oxygen Therapy: Patient Spontanous Breathing and Patient connected to nasal cannula oxygen  Post-op Assessment: Report given to RN and Post -op Vital signs reviewed and stable  Post vital signs: Reviewed and stable  Last Vitals:  Vitals Value Taken Time  BP 139/55 08/18/2017  3:30 PM  Temp 36.7 C 08/18/2017  3:29 PM  Pulse 67 08/18/2017  3:35 PM  Resp 21 08/18/2017  3:35 PM  SpO2 92 % 08/18/2017  3:35 PM  Vitals shown include unvalidated device data.  Last Pain:  Vitals:   08/18/17 1529  TempSrc:   PainSc: 0-No pain         Complications: No apparent anesthesia complications

## 2017-08-18 NOTE — Anesthesia Procedure Notes (Signed)
Anesthesia Regional Block: Interscalene brachial plexus block   Pre-Anesthetic Checklist: ,, timeout performed, Correct Patient, Correct Site, Correct Laterality, Correct Procedure, Correct Position, site marked, Risks and benefits discussed,  Surgical consent,  Pre-op evaluation,  At surgeon's request and post-op pain management  Laterality: Right  Prep: chloraprep       Needles:  Injection technique: Single-shot  Needle Type: Echogenic Needle     Needle Length: 9cm  Needle Gauge: 21     Additional Needles:   Procedures:,,,, ultrasound used (permanent image in chart),,,,  Narrative:  Start time: 08/18/2017 12:40 PM End time: 08/18/2017 12:50 PM Injection made incrementally with aspirations every 5 mL.  Performed by: Personally  Anesthesiologist: Effie Berkshire, MD  Additional Notes: Patient tolerated the procedure well. Local anesthetic introduced in an incremental fashion under minimal resistance after negative aspirations. No paresthesias were elicited. After completion of the procedure, no acute issues were identified and patient continued to be monitored by RN.

## 2017-08-19 ENCOUNTER — Encounter (HOSPITAL_COMMUNITY): Payer: Self-pay | Admitting: Orthopaedic Surgery

## 2017-08-19 ENCOUNTER — Ambulatory Visit (HOSPITAL_COMMUNITY): Payer: Medicare Other

## 2017-08-19 DIAGNOSIS — I10 Essential (primary) hypertension: Secondary | ICD-10-CM | POA: Diagnosis not present

## 2017-08-19 DIAGNOSIS — I4891 Unspecified atrial fibrillation: Secondary | ICD-10-CM | POA: Diagnosis present

## 2017-08-19 DIAGNOSIS — K219 Gastro-esophageal reflux disease without esophagitis: Secondary | ICD-10-CM | POA: Diagnosis present

## 2017-08-19 DIAGNOSIS — R0789 Other chest pain: Secondary | ICD-10-CM | POA: Diagnosis not present

## 2017-08-19 DIAGNOSIS — S42201A Unspecified fracture of upper end of right humerus, initial encounter for closed fracture: Secondary | ICD-10-CM | POA: Diagnosis not present

## 2017-08-19 DIAGNOSIS — F419 Anxiety disorder, unspecified: Secondary | ICD-10-CM | POA: Diagnosis present

## 2017-08-19 DIAGNOSIS — I480 Paroxysmal atrial fibrillation: Secondary | ICD-10-CM | POA: Diagnosis not present

## 2017-08-19 LAB — CBC
HCT: 29.4 % — ABNORMAL LOW (ref 36.0–46.0)
Hemoglobin: 9.6 g/dL — ABNORMAL LOW (ref 12.0–15.0)
MCH: 33.1 pg (ref 26.0–34.0)
MCHC: 32.7 g/dL (ref 30.0–36.0)
MCV: 101.4 fL — AB (ref 78.0–100.0)
PLATELETS: 232 10*3/uL (ref 150–400)
RBC: 2.9 MIL/uL — AB (ref 3.87–5.11)
RDW: 14.2 % (ref 11.5–15.5)
WBC: 7 10*3/uL (ref 4.0–10.5)

## 2017-08-19 LAB — TROPONIN I
Troponin I: <0.03 ng/mL (ref <0.03)
Troponin I: <0.03 ng/mL (ref <0.03)

## 2017-08-19 LAB — BASIC METABOLIC PANEL
Anion gap: 14 (ref 5–15)
BUN: 26 mg/dL — AB (ref 6–20)
CO2: 22 mmol/L (ref 22–32)
Calcium: 7.6 mg/dL — ABNORMAL LOW (ref 8.9–10.3)
Chloride: 104 mmol/L (ref 101–111)
Creatinine, Ser: 1.44 mg/dL — ABNORMAL HIGH (ref 0.44–1.00)
GFR calc Af Amer: 37 mL/min — ABNORMAL LOW (ref >60)
GFR, EST NON AFRICAN AMERICAN: 32 mL/min — AB (ref >60)
GLUCOSE: 129 mg/dL — AB (ref 65–99)
POTASSIUM: 4.4 mmol/L (ref 3.5–5.1)
Sodium: 140 mmol/L (ref 135–145)

## 2017-08-19 LAB — ECHOCARDIOGRAM COMPLETE
Height: 64 in
Weight: 2800 oz

## 2017-08-19 MED ORDER — OXYCODONE HCL 5 MG PO TABS: ORAL_TABLET | ORAL | 0 refills | Status: AC

## 2017-08-19 MED ORDER — ACETAMINOPHEN 500 MG PO TABS: 1000.0000 mg | ORAL_TABLET | Freq: Three times a day (TID) | ORAL | 0 refills | Status: AC

## 2017-08-19 MED ORDER — ONDANSETRON HCL 4 MG PO TABS: 4.0000 mg | ORAL_TABLET | Freq: Three times a day (TID) | ORAL | 1 refills | Status: AC | PRN

## 2017-08-19 NOTE — Progress Notes (Signed)
Pt is ambulating with standby assist. Right arm in a sling. Pain is controlled with Tylenol. Discharge instructions given to pt and son, verbalized understanding. Discharged home.

## 2017-08-19 NOTE — Progress Notes (Signed)
ORTHOPAEDIC PROGRESS NOTE  s/p Procedure(s): RIGHT  OPEN REDUCTION INTERNAL FIXATION (ORIF) PROXIMAL HUMERUS  SUBJECTIVE: Complains of chest tightness and anxiety similar to preop.  She is worried about this.  Block still in place, no pain.  OBJECTIVE: PE:  Dressing CDI and sling well fitting,  full and painless ROM throughout hand with [facility] of 0. +Block in place limiting sensory exam and axillary nerve exam.  Well perfused digits.     Vitals:   08/18/17 2049 08/19/17 0558  BP: (!) 139/56 (!) 145/58  Pulse: 60 69  Resp: 19 17  Temp: 97.9 F (36.6 C) 98.1 F (36.7 C)  SpO2: 98% 99%     ASSESSMENT: Christine Conley is a 84 y.o. female doing well postoperatively but reporting some mild chest tightness.    Will order some baseline labs and ekg and have medicine consult to see if they have anything to add.  Plan for dc home if that workup is negative.    PLAN: Weightbearing: NWB RUE Insicional and dressing care: Dressings left intact until follow-up Orthopedic device(s): None Showering: prn, needs additional sling in shower so she can transition back and forth. VTE prophylaxis:  not indicated in isolated upper extremity surgery patient with no specific risks factors Pain control: prn meds, minimize narcs Follow - up plan: 1 week Contact information:  Weekdays 8-5 Christine Charter MD [phone], After hours and holidays please check [email or web address] for group call information for [facility]

## 2017-08-19 NOTE — Progress Notes (Signed)
Patient has had 2- delta troponins with a third one pending.  Echocardiogram has been performed but results not yet available.  As noted earlier initial EKG is negative and patient has not had any chest pain since early this morning.  Orthopedic physician updated via hip with appropriate telephone message that patient is appropriate for discharge home with recommendations to follow-up with cardiologist after discharge.  Erin Hearing, ANP

## 2017-08-19 NOTE — Evaluation (Signed)
Occupational Therapy Evaluation Patient Details Name: Christine Conley MRN: [id number] DOB: 09-18-1928 Today's Date: 08/19/2017    History of Present Illness Pt is 85 yo female s/p ORIF right proximal humerus fracture with allograft bone grafting. PMH includes history of paroxysmal atrial fibrillation and anxiety.     Clinical Impression   Pt admitted with the above diagnoses and presents with below problem list. Pt will benefit from continued acute OT to address the below listed deficits and maximize independence with ADLs prior to d/c home. PTA pt was mostly independent with ADLs, very occasional use of cane. Pt is currently close min guard to min A (HHA) ambulating in the room and to/from bathroom. Mod A for UB/LB bathing and dressing. Pt reports she will stay with son and daughter in law initially at d/c.  Pt benefits from multiple repetition of key information, written instructions also provided.      Follow Up Recommendations  Follow surgeon's recommendation for DC plan and follow-up therapies;Supervision/Assistance - 24 hour    Equipment Recommendations  3 in 1 bedside commode    Recommendations for Other Services PT consult (pt has stairs to enter son's residence and appears more unsteady than baseline.)     Precautions / Restrictions Precautions Precautions: Shoulder Shoulder Interventions: Shoulder sling/immobilizer;At all times;Off for dressing/bathing/exercises(Secondary sling on or arm supported in position shower) Precaution Booklet Issued: Yes (comment) Precaution Comments: written instructions provided Required Braces or Orthoses: Sling Restrictions Weight Bearing Restrictions: Yes RUE Weight Bearing: Non weight bearing      Mobility Bed Mobility               General bed mobility comments: up in chair  Transfers Overall transfer level: Needs assistance Equipment used: 1 person hand held assist(pushed IV pole) Transfers: Sit to/from Stand Sit to Stand: Min  guard         General transfer comment: close min guard for safety seeks external support of IV pole or HHA    Balance Overall balance assessment: Needs assistance Sitting-balance support: Single extremity supported;Feet supported Sitting balance-Leahy Scale: Fair     Standing balance support: Single extremity supported;During functional activity Standing balance-Leahy Scale: Poor Standing balance comment: fair static, poor dynamic                           ADL either performed or assessed with clinical judgement   ADL Overall ADL's : Needs assistance/impaired Eating/Feeding: Set up;Sitting   Grooming: Moderate assistance;Sitting   Upper Body Bathing: Moderate assistance;Sitting   Lower Body Bathing: Moderate assistance;Sit to/from stand   Upper Body Dressing : Moderate assistance;Sitting   Lower Body Dressing: Moderate assistance;Sit to/from stand   Toilet Transfer: Ambulation;Min Psychiatric nurse Details (indicate cue type and reason): IV pole to stabilize while walking Toileting- Clothing Manipulation and Hygiene: Set up;Minimal assistance;Sitting/lateral lean;Sit to/from stand   Tub/ Shower Transfer: Min guard;Stand-pivot;Tub bench;Tub transfer   Functional mobility during ADLs: Min guard(IV pole) General ADL Comments: Close min guard for safety ambulating in the room. Provided written instructions and repeated key information multiple times due to baseline memory impairment.      Vision         Perception     Praxis      Pertinent Vitals/Pain Pain Assessment: Faces Faces Pain Scale: Hurts a little bit Pain Location: RUE, nerve block still largely intact Pain Intervention(s): Monitored during session     Hand Dominance     Extremity/Trunk Assessment Upper  Extremity Assessment Upper Extremity Assessment: RUE deficits/detail RUE Deficits / Details: s/p ORIF right proximal humerus fracture with allograft bone grafting, Nerve block still  largely in effect but gross sensation starting to come back and pt able to AROM wrist and hand.  RUE: Unable to fully assess due to immobilization   Lower Extremity Assessment Lower Extremity Assessment: Defer to PT evaluation       Communication Communication Communication: No difficulties   Cognition Arousal/Alertness: Awake/alert Behavior During Therapy: WFL for tasks assessed/performed;Anxious Overall Cognitive Status: History of cognitive impairments - at baseline                                 General Comments: h/o memory impairment. Needs lots of reinforcement for shoulder education. Provided written instructions.   General Comments       Exercises     Shoulder Instructions      Home Living Family/patient expects to be discharged to:: Private residence Living Arrangements: Children Available Help at Discharge: Family;Available 24 hours/day Type of Home: House Home Access: Stairs to enter [employer] of Steps: 5 Entrance Stairs-Rails: Right Home Layout: One level     Bathroom Shower/Tub: Teacher, early years/pre: Standard     Home Equipment: Cane - single point;Tub bench   Additional Comments: Plans to stay with son and daughter in law initially then transition back to ILF. Home setup data is for son's house.      Prior Functioning/Environment Level of Independence: Independent        Comments: mostly independent, occasional cane use        OT Problem List: Impaired balance (sitting and/or standing);Decreased knowledge of use of DME or AE;Decreased knowledge of precautions;Impaired UE functional use;Pain      OT Treatment/Interventions: Self-care/ADL training;Therapeutic exercise;DME and/or AE instruction;Therapeutic activities;Cognitive remediation/compensation;Patient/family education;Balance training    OT Goals(Current goals can be found in the care plan section) Acute Rehab OT Goals Patient Stated Goal: home  when ready OT Goal Formulation: With patient Time For Goal Achievement: 08/26/17 Potential to Achieve Goals: Good ADL Goals Pt Will Perform Grooming: with modified independence;sitting Pt Will Perform Upper Body Bathing: with supervision;sitting Pt Will Perform Lower Body Bathing: with supervision;sit to/from stand Pt Will Perform Upper Body Dressing: with supervision;sitting Pt Will Perform Lower Body Dressing: with supervision;sit to/from stand Pt Will Transfer to Toilet: with supervision;ambulating Pt Will Perform Toileting - Clothing Manipulation and hygiene: with supervision;sit to/from stand Pt/caregiver will Perform Home Exercise Program: With written HEP provided;With Supervision Additional ADL Goal #1: Pt and caregivers will be independent with shoulder and ADL education.  OT Frequency: Min 3X/week   Barriers to D/C:            Co-evaluation              AM-PAC PT "6 Clicks" Daily Activity     Outcome Measure Help from another person eating meals?: None Help from another person taking care of personal grooming?: A Little Help from another person toileting, which includes using toliet, bedpan, or urinal?: A Little Help from another person bathing (including washing, rinsing, drying)?: A Lot Help from another person to put on and taking off regular upper body clothing?: A Lot Help from another person to put on and taking off regular lower body clothing?: A Lot 6 Click Score: 16   End of Session Equipment Utilized During Treatment: Gait belt;Other (comment)(sling)  Activity Tolerance: Patient tolerated  treatment well Patient left: in chair;with call bell/phone within reach  OT Visit Diagnosis: Unsteadiness on feet (R26.81);Pain;Other symptoms and signs involving cognitive function Pain - Right/Left: Right Pain - part of body: Arm                Time: 1126-1207 OT Time Calculation (min): 41 min Charges:  OT General Charges $OT Visit: 1 Visit OT Evaluation $OT  Eval Low Complexity: 1 Low OT Treatments $Self Care/Home Management : 23-37 mins G-Codes:       Hortencia Pilar 08/19/2017, 12:38 PM

## 2017-08-19 NOTE — Consult Note (Addendum)
Consultation Note   Christine Conley JKK:[id number] DOB: 1928-12-06 DOA: 08/18/2017   PCP: [name], MD   Consulting physician: Evangeline Gula  Requesting physician: Griffin Basil  Reason for consultation: Chest tightness  HPI: Christine Conley is a 84 y.o. female with medical history significant for PAF, anxiety disorder, reflux, hypertension and osteoarthritis.  Patient fell 3 weeks ago sustaining a right humeral closed fracture.  Failed conservative management in the outpatient setting so was brought in to undergo elective surgical repair on 5/1.  Orthopedic physician reports that prior to procedure patient was having nonspecific chest tightness that she attributed to anxiety.  The symptoms have persisted postoperatively and given her age and medical history orthopedic team requested evaluation for possible ischemic etiology to patient's chest discomfort.  Patient has a primary cardiologist Dr. Terrence Dupont.   Review of Systems:  In addition to the HPI above,  No Fever-chills, myalgias or other constitutional symptoms No Headache, changes with Vision or hearing, new weakness, tingling, numbness in any extremity, dizziness, dysarthria or word finding difficulty, gait disturbance or imbalance, tremors or seizure activity No problems swallowing food or Liquids, indigestion/reflux, choking or coughing while eating, abdominal pain with or after eating No Cough, has chronic shortness of Breath especially with ambulating, no palpitations, orthopnea or DOE No Abdominal pain, N/V, melena,hematochezia, dark tarry stools, constipation No dysuria, malodorous urine, hematuria or flank pain No new skin rashes, lesions, masses or bruises, No new joint pains, aches, swelling or redness No recent unintentional weight gain or loss No polyuria, polydypsia or polyphagia   Past Medical History:  Diagnosis Date  . Acid reflux   . Anxiety   . Atrial fibrillation ([place])   . Bilateral ovarian cysts   . Hypertension     . Memory loss    patient has been tested for dementia but was not diagnosed    Past Surgical History:  Procedure Laterality Date  . COLONOSCOPY    . ESOPHAGOGASTRODUODENOSCOPY    . EYE SURGERY     "to fix cross eyed"  . KNEE SURGERY Left    Replacement  . OVARIAN CYST SURGERY      Social History   Socioeconomic History  . Marital status: Widowed    Spouse name: Not on file  . Number of children: 3  . Years of education: 78  . Highest education level: Not on file  Occupational History  . Occupation: Retired  Scientific laboratory technician  . Financial resource strain: Not on file  . Food insecurity:    Worry: Not on file    Inability: Not on file  . Transportation needs:    Medical: Not on file    Non-medical: Not on file  Tobacco Use  . Smoking status: Former Research scientist (life sciences)  . Smokeless tobacco: Never Used  Substance and Sexual Activity  . Alcohol use: No  . Drug use: No  . Sexual activity: Not on file  Lifestyle  . Physical activity:    Days per week: Not on file    Minutes per session: Not on file  . Stress: Not on file  Relationships  . Social connections:    Talks on phone: Not on file    Gets together: Not on file    Attends religious service: Not on file    Active member of club or organization: Not on file    Attends meetings of clubs or organizations: Not on file    Relationship status: Not on file  . Intimate partner violence:  Fear of current or ex partner: Not on file    Emotionally abused: Not on file    Physically abused: Not on file    Forced sexual activity: Not on file  Other Topics Concern  . Not on file  Social History Narrative   Lives at home alone.   Right-handed.   Occasional use of caffeine.    Mobility: Occasionally requires a cane Work history: Not obtained   Allergies  Allergen Reactions  . Aspirin Palpitations    PVC's  . Novocain [Procaine] Palpitations  . Penicillins Other (See Comments)    Localized mass, 'size of  grapefruit' PATIENT HAS HAD A PCN REACTION WITH IMMEDIATE RASH, FACIAL/TONGUE/THROAT SWELLING, SOB, OR LIGHTHEADEDNESS WITH HYPOTENSION:  #  #  YES  #  #  Has patient had a PCN reaction causing severe rash involving mucus membranes or skin necrosis: No Has patient had a PCN reaction that required hospitalization: No Has patient had a PCN reaction occurring within the last 10 years: No  . Meloxicam     UNSPECIFIED REACTION   . Codeine Other (See Comments)    hallucinations    Family History  Problem Relation Age of Onset  . Other Mother        died at 59 - old age  . Heart disease Father        died at 59    Prior to Admission medications   Medication Sig Start Date End Date Taking? Authorizing Provider  acetaminophen (TYLENOL) 500 MG tablet Take 1,000 mg by mouth every 6 (six) hours as needed for moderate pain.   Yes [provider]  amiodarone (PACERONE) 200 MG tablet Take 1 tablet (200 mg total) by mouth daily. 10/27/16  Yes Gareth Morgan, MD  atorvastatin (LIPITOR) 10 MG tablet Take 10 mg by mouth daily. 07/03/17  Yes [provider]  furosemide (LASIX) 20 MG tablet Take 20 mg by mouth daily. 03/15/17  Yes [provider]  metoprolol succinate (TOPROL-XL) 50 MG 24 hr tablet Take 1 tablet (50 mg total) by mouth daily. 11/13/16  Yes Jola Schmidt, MD  pantoprazole (PROTONIX) 40 MG tablet Take 40 mg by mouth 2 (two) times daily.  08/10/16  Yes [provider]  PROCTO-MED HC 2.5 % rectal cream Place 1 application rectally 2 (two) times daily as needed. 07/07/17  Yes [provider]  Rivaroxaban (XARELTO) 15 MG TABS tablet Take 1 tablet (15 mg total) by mouth daily with supper. 10/27/16  Yes Gareth Morgan, MD  ciprofloxacin (CIPRO) 500 MG tablet Take 1 tablet (500 mg total) by mouth 2 (two) times daily. One po bid x 7 days Patient not taking: Reported on 08/11/2017 03/30/17   Julianne Rice, MD  HYDROcodone-acetaminophen (NORCO/VICODIN) 5-325  MG tablet Take 1 tablet by mouth every 6 (six) hours as needed. Patient not taking: Reported on 08/11/2017 07/30/17   Davonna Belling, MD  metroNIDAZOLE (FLAGYL) 500 MG tablet Take 1 tablet (500 mg total) by mouth 2 (two) times daily. One po bid x 7 days Patient not taking: Reported on 08/11/2017 03/30/17   Julianne Rice, MD    Physical Exam: Vitals:   08/18/17 1701 08/18/17 1712 08/18/17 2049 08/19/17 0558  BP:  (!) 128/109 (!) 139/56 (!) 145/58  Pulse: 61 61 60 69  Resp: (!) 22 (!) 24 19 17   Temp: (!) 97.3 F (36.3 C) 97.7 F (36.5 C) 97.9 F (36.6 C) 98.1 F (36.7 C)  TempSrc:  Oral Oral Oral  SpO2: 92%  96% 98% 99%  Weight:      Height:          Constitutional: NAD, calm, comfortable Eyes: PERRL, lids and conjunctivae normal ENMT: Mucous membranes are moist. Posterior pharynx clear of any exudate or lesions.Normal dentition.  Neck: normal, supple, no masses, no thyromegaly Respiratory: clear to auscultation bilaterally, no wheezing, no crackles. Normal respiratory effort. No accessory muscle use.  Anterior chest wall slightly tender to palpation Cardiovascular: Regular rate and rhythm, no murmurs / rubs / gallops. No extremity edema. 2+ pedal pulses. No carotid bruits.  Abdomen: no tenderness, no masses palpated. No hepatosplenomegaly. Bowel sounds positive.  Epigastrium slightly tender to palpation. Musculoskeletal: no clubbing / cyanosis. No joint deformity upper and lower extremities. Good ROM, no contractures. Normal muscle tone.  Right arm in sling Skin: no rashes, lesions, ulcers. No induration Neurologic: CN 2-12 grossly intact. Sensation intact, DTR normal. Strength 5/5 x all 4 extremities.  Psychiatric: Normal judgment and insight. Alert and oriented x 3. Normal mood.    Labs on Admission: I have personally reviewed following labs and imaging studies  CBC: Recent Labs  Lab 08/12/17 0919 08/18/17 1024 08/19/17 0727  WBC 5.2  --  7.0  HGB 10.2* 10.2* 9.6*   HCT 32.5* 30.0* 29.4*  MCV 101.9*  --  101.4*  PLT 279  --  329   Basic Metabolic Panel: Recent Labs  Lab 08/12/17 0919 08/18/17 1024 08/19/17 0727  NA 142 142 140  K 3.7 3.8 4.4  CL 108  --  104  CO2 22  --  22  GLUCOSE 103* 105* 129*  BUN 26*  --  26*  CREATININE 1.58*  --  1.44*  CALCIUM 7.7*  --  7.6*   GFR: Estimated Creatinine Clearance: 28.1 mL/min (A) (by C-G formula based on SCr of 1.44 mg/dL (H)). Liver Function Tests: No results for input(s): AST, ALT, ALKPHOS, BILITOT, PROT, ALBUMIN in the last 168 hours. No results for input(s): LIPASE, AMYLASE in the last 168 hours. No results for input(s): AMMONIA in the last 168 hours. Coagulation Profile: Recent Labs  Lab 08/18/17 1033  INR 1.24   Cardiac Enzymes: Recent Labs  Lab 08/19/17 0727  TROPONINI <0.03   BNP (last 3 results) No results for input(s): PROBNP in the last 8760 hours. HbA1C: No results for input(s): HGBA1C in the last 72 hours. CBG: No results for input(s): GLUCAP in the last 168 hours. Lipid Profile: No results for input(s): CHOL, HDL, LDLCALC, TRIG, CHOLHDL, LDLDIRECT in the last 72 hours. Thyroid Function Tests: No results for input(s): TSH, T4TOTAL, FREET4, T3FREE, THYROIDAB in the last 72 hours. Anemia Panel: No results for input(s): VITAMINB12, FOLATE, FERRITIN, TIBC, IRON, RETICCTPCT in the last 72 hours. Urine analysis:    Component Value Date/Time   COLORURINE YELLOW 03/30/2017 1135   APPEARANCEUR CLEAR 03/30/2017 1135   LABSPEC 1.035 (H) 03/30/2017 1135   PHURINE 7.0 03/30/2017 1135   GLUCOSEU NEGATIVE 03/30/2017 1135   HGBUR NEGATIVE 03/30/2017 1135   BILIRUBINUR NEGATIVE 03/30/2017 1135   KETONESUR NEGATIVE 03/30/2017 1135   PROTEINUR NEGATIVE 03/30/2017 1135   UROBILINOGEN 0.2 04/26/2008 0116   NITRITE NEGATIVE 03/30/2017 1135   LEUKOCYTESUR NEGATIVE 03/30/2017 1135   Sepsis Labs: @LABRCNTIP (procalcitonin:4,lacticidven:4) )No results found for this or any previous  visit (from the past 240 hour(s)).   Radiological Exams on Admission: Dg Shoulder Right Port  Result Date: 08/18/2017 CLINICAL DATA:  Right shoulder ORIF. EXAM: PORTABLE RIGHT SHOULDER COMPARISON:  Single-view plate screw fixation intraoperative  radiographs of the right upper extremity. FINDINGS: A single internally rotated view demonstrates plate and screw fixation of the proximal right humerus fracture. Gross anatomic alignment is present. Shoulder appears located. The hemithorax is clear. Mild pulmonary vascular congestion is present. Vascular calcifications are present at the aortic arch. IMPRESSION: Right proximal humerus ORIF without radiographic evidence for complication. Electronically Signed   By: San Morelle M.D.   On: 08/18/2017 16:51   Dg Humerus Right  Result Date: 08/18/2017 CLINICAL DATA:  Open reduction and internal fixation of proximal right humeral fracture. EXAM: DG C-ARM 61-120 MIN; RIGHT HUMERUS - 2+ VIEW FLUOROSCOPY TIME:  54 seconds. COMPARISON:  Radiographs of July 30, 2017. FINDINGS: Two intraoperative fluoroscopic images of the right shoulder demonstrate surgical internal fixation proximal right humeral shaft fracture. Good alignment of fracture components is noted. IMPRESSION: Status post open reduction and internal fixation of proximal right humeral shaft fracture. Electronically Signed   By: Marijo Conception, M.D.   On: 08/18/2017 16:51   Dg C-arm 1-60 Min  Result Date: 08/18/2017 CLINICAL DATA:  Open reduction and internal fixation of proximal right humeral fracture. EXAM: DG C-ARM 61-120 MIN; RIGHT HUMERUS - 2+ VIEW FLUOROSCOPY TIME:  54 seconds. COMPARISON:  Radiographs of July 30, 2017. FINDINGS: Two intraoperative fluoroscopic images of the right shoulder demonstrate surgical internal fixation proximal right humeral shaft fracture. Good alignment of fracture components is noted. IMPRESSION: Status post open reduction and internal fixation of proximal right  humeral shaft fracture. Electronically Signed   By: Marijo Conception, M.D.   On: 08/18/2017 16:51   Dg C-arm 1-60 Min  Result Date: 08/18/2017 CLINICAL DATA:  Open reduction and internal fixation of proximal right humeral fracture. EXAM: DG C-ARM 61-120 MIN; RIGHT HUMERUS - 2+ VIEW FLUOROSCOPY TIME:  54 seconds. COMPARISON:  Radiographs of July 30, 2017. FINDINGS: Two intraoperative fluoroscopic images of the right shoulder demonstrate surgical internal fixation proximal right humeral shaft fracture. Good alignment of fracture components is noted. IMPRESSION: Status post open reduction and internal fixation of proximal right humeral shaft fracture. Electronically Signed   By: Marijo Conception, M.D.   On: 08/18/2017 16:51    EKG: (Independently reviewed) sinus rhythm with ventricular rate 68 bpm, QTC 459 ms, normal artery rotation, no acute ischemic changes  Assessment/Plan Active Problems:   Chest tightness -Patient without history of CAD reports nonspecific chest tightness both pre-and postoperatively without any typical symptoms such as radiation to jaw or arm, nausea, or new shortness of breath. -Chest pain is reproducible with palpation of her epigastrium as well as anterior chest wall -We will attempt to obtain echocardiogram prior to anticipated planned discharge for later today-if unable to achieve patient can obtain at cardiologist office after discharge if indicated -Cycle troponin every 3 hours-if negative patient can likely discharge home later today **initial troponin <0.03 -Recommend patient follow-up with primary cardiologist Dr. Terrence Dupont after discharge    Hypertension -Current blood pressure controlled on metoprolol    Paroxysmal atrial fibrillation  -Currently maintaining sinus rhythm on beta-blocker and amiodarone -Preadmission Xarelto has been renewed in the postoperative setting by orthopedic physician -CHA2DS2-VASc=4    Anxiety -Patient has previously taken Valium -Gust  with patient and son that a better option would be SSRI noting side effect profile of benzodiazepines in elderly patients not optimal    Acid reflux -Some of current chest tightness may be related to reflux and having reproducible epigastric pain and patient report of discomfort radiating up from epigastrium to chest -  Continue PPI    HLD -Continue Lipitor    Fracture of humerus, proximal, right, closed/Closed fracture of right proximal humerus -per orthopedic team    **Additional lab, imaging and/or diagnostic evaluation at discretion of supervising physician  DVT prophylaxis: Lovenox Code Status: DO NOT RESUSCITATE Family Communication: Caregiver at bedside Disposition Plan: TBD Consults called: Palliative medicine    ELLIS,ALLISON L. ANP-BC [facility] Pager [phone]   If 7PM-7AM, please contact night-coverage [email or web address] Password [facility]  08/19/2017, 9:16 AM

## 2017-08-19 NOTE — Anesthesia Postprocedure Evaluation (Signed)
Anesthesia Post Note  Patient: MYRACLE FEBRES  Procedure(s) Performed: RIGHT  OPEN REDUCTION INTERNAL FIXATION (ORIF) PROXIMAL HUMERUS (Right )     Patient location during evaluation: PACU Anesthesia Type: Regional Level of consciousness: awake and alert Pain management: pain level controlled Vital Signs Assessment: post-procedure vital signs reviewed and stable Respiratory status: spontaneous breathing, nonlabored ventilation, respiratory function stable and patient connected to nasal cannula oxygen Cardiovascular status: blood pressure returned to baseline and stable Postop Assessment: no apparent nausea or vomiting Anesthetic complications: no    Last Vitals:  Vitals:   08/18/17 2049 08/19/17 0558  BP: (!) 139/56 (!) 145/58  Pulse: 60 69  Resp: 19 17  Temp: 36.6 C 36.7 C  SpO2: 98% 99%    Last Pain:  Vitals:   08/19/17 0558  TempSrc: Oral  PainSc:                  Effie Berkshire

## 2017-08-19 NOTE — Discharge Instructions (Signed)

## 2017-08-19 NOTE — Discharge Summary (Signed)
Patient ID: Christine Conley MRN: [id number] DOB/AGE: [birth year] 84 y.o.  Admit date: 08/18/2017 Discharge date: 08/19/2017  Admission Diagnoses:R proximal humerus fracture  Discharge Diagnoses:  Active Problems:   Fracture of humerus, proximal, right, closed   Closed fracture of right proximal humerus   Chest tightness   Hypertension   Atrial fibrillation (HCC)   Anxiety   Acid reflux   Past Medical History:  Diagnosis Date  . Acid reflux   . Anxiety   . Atrial fibrillation ([place])   . Bilateral ovarian cysts   . Hypertension   . Memory loss    patient has been tested for dementia but was not diagnosed     Procedures Performed: R ORIF proximal humerus  Discharged Condition: good  Hospital Course: Patient brought in as an outpatient for surgery.  Tolerated procedure well.  Was kept for monitoring overnight for pain control and medical monitoring postop.  Patient has history of anxiety and afib and reported some chest tightness POD1 morning.  We obtained basic labs and troponins which were cycled and a medicine consult.  The medical team based on negative labs thought patient was stable for dc from heart perspective POD1 and would schedule outpatient echo.  Patient was instructed on specific activity restrictions and all questions were answered.  Patient was home with same mobility for 2 weeks almost preop and surgery did not change mobility thus patient discharged home again.  Consults: Medicine  Significant Diagnostic Studies: Troponins, basic labs  Treatments: Surgery  Discharge Exam:  Dressing CDI and sling well fitting,  full and painless ROM throughout hand with DPC of 0. Block still in place.  Well perfused digits.     Disposition: Discharge disposition: 01-Home or Self Care       Discharge Instructions    Call MD for:  persistant nausea and vomiting   Complete by:  As directed    Call MD for:  redness, tenderness, or signs of infection (pain, swelling,  redness, odor or green/yellow discharge around incision site)   Complete by:  As directed    Call MD for:  severe uncontrolled pain   Complete by:  As directed    Diet - low sodium heart healthy   Complete by:  As directed    Discharge instructions   Complete by:  As directed    Ophelia Charter MD, MPH [place]. [address] [phone] (tel)   [phone] (fax)   [place] may leave the operative dressing in place until your follow-up appointment. KEEP THE INCISIONS CLEAN AND DRY. Use the Cryocuff, GameReady or Ice as often as possible for the first 3-4 days, then as needed for pain relief.  You may shower on Post-Op Day #2. The dressing is water resistant but do not scrub it as it may start to peel up.  You may remove the sling for showering, but keep a water resistant pillow under the arm to keep both the elbow and shoulder away from the body (mimicking the abduction sling). Gently pat the area dry. Do not soak the shoulder in water. Do not go swimming in the pool or ocean until your sutures are removed.  EXERCISES Wear the sling at all times except when doing your exercises. You may remove the sling for showering, but keep the arm across the chest or in a secondary sling.   Accidental/Purposeful External Rotation and shoulder flexion (reaching behind you) is to be  avoided at all costs for the first month. Please perform the exercises:   Elbow / Hand / Wrist  Range of Motion Exercises POST-OP A multi-modal approach will be used to treat your pain. Oxycodone - This is a strong narcotic, to be used only on an "as needed" basis for pain. Acetaminophen - A non-narcotic pain medicine.  Use 1000mg  three times a day for the first 14 days after surgery If you have any adverse effects with the medications, please call our office.  FOLLOW-UP If you develop a Fever (>101.5), Redness or Drainage from the surgical incision  site, please call our office to arrange for an evaluation. Please call the office to schedule a follow-up appointment for a wound check, 7-10 days post-operatively.    IF YOU HAVE ANY QUESTIONS, PLEASE FEEL FREE TO CALL OUR OFFICE.   Increase activity slowly   Complete by:  As directed      Allergies as of 08/19/2017      Reactions   Aspirin Palpitations   PVC's   Novocain [procaine] Palpitations   Penicillins Other (See Comments)   Localized mass, 'size of grapefruit' PATIENT HAS HAD A PCN REACTION WITH IMMEDIATE RASH, FACIAL/TONGUE/THROAT SWELLING, SOB, OR LIGHTHEADEDNESS WITH HYPOTENSION:  #  #  YES  #  #  Has patient had a PCN reaction causing severe rash involving mucus membranes or skin necrosis: No Has patient had a PCN reaction that required hospitalization: No Has patient had a PCN reaction occurring within the last 10 years: No   Meloxicam    UNSPECIFIED REACTION    Codeine Other (See Comments)   hallucinations      Medication List    STOP taking these medications   HYDROcodone-acetaminophen 5-325 MG tablet Commonly known as:  NORCO/VICODIN     TAKE these medications   acetaminophen 500 MG tablet Commonly known as:  TYLENOL Take 2 tablets (1,000 mg total) by mouth every 8 (eight) hours for 14 days. What changed:    when to take this  reasons to take this   amiodarone 200 MG tablet Commonly known as:  PACERONE Take 1 tablet (200 mg total) by mouth daily.   atorvastatin 10 MG tablet Commonly known as:  LIPITOR Take 10 mg by mouth daily.   ciprofloxacin 500 MG tablet Commonly known as:  CIPRO Take 1 tablet (500 mg total) by mouth 2 (two) times daily. One po bid x 7 days   furosemide 20 MG tablet Commonly known as:  LASIX Take 20 mg by mouth daily.   metoprolol succinate 50 MG 24 hr tablet Commonly known as:  TOPROL-XL Take 1 tablet (50 mg total) by mouth daily.   metroNIDAZOLE 500 MG tablet Commonly known as:  FLAGYL Take 1 tablet (500 mg total)  by mouth 2 (two) times daily. One po bid x 7 days   ondansetron 4 MG tablet Commonly known as:  ZOFRAN Take 1 tablet (4 mg total) by mouth every 8 (eight) hours as needed for up to 7 days for nausea or vomiting.   oxyCODONE 5 MG immediate release tablet Commonly known as:  Oxy IR/ROXICODONE Take 1 pills every 6 hrs as needed for pain   pantoprazole 40 MG tablet Commonly known as:  PROTONIX Take 40 mg by mouth 2 (two) times daily.   PROCTO-MED HC 2.5 % rectal cream Generic drug:  hydrocortisone Place 1 application rectally 2 (two) times daily as needed.   Rivaroxaban 15 MG Tabs tablet Commonly known as:  XARELTO Take  1 tablet (15 mg total) by mouth daily with supper.      Follow-up Information    Charolette Forward, MD. Schedule an appointment as soon as possible for a visit in 1 week(s).   Specialty:  Cardiology Why:  Please call to schedule hospital follow-up regarding recent chest pain reports Contact information: [address] [place] [address] [phone]

## 2017-08-19 NOTE — Progress Notes (Signed)
  Echocardiogram 2D Echocardiogram has been performed.  Christine Conley 08/19/2017, 1:56 PM

## 2019-04-21 ENCOUNTER — Emergency Department (HOSPITAL_COMMUNITY): Payer: Medicare Other

## 2019-04-21 ENCOUNTER — Inpatient Hospital Stay (HOSPITAL_COMMUNITY)
Admission: EM | Admit: 2019-04-21 | Discharge: 2019-04-23 | DRG: 065 | Disposition: A | Discharge status: Home / Self Care | Payer: Medicare Other | Attending: Internal Medicine | Admitting: Internal Medicine

## 2019-04-21 ENCOUNTER — Inpatient Hospital Stay (HOSPITAL_COMMUNITY): Payer: Medicare Other

## 2019-04-21 ENCOUNTER — Encounter (HOSPITAL_COMMUNITY): Payer: Self-pay | Admitting: Emergency Medicine

## 2019-04-21 ENCOUNTER — Other Ambulatory Visit: Payer: Self-pay

## 2019-04-21 DIAGNOSIS — F0390 Unspecified dementia without behavioral disturbance: Secondary | ICD-10-CM | POA: Diagnosis present

## 2019-04-21 DIAGNOSIS — I129 Hypertensive chronic kidney disease with stage 1 through stage 4 chronic kidney disease, or unspecified chronic kidney disease: Secondary | ICD-10-CM | POA: Diagnosis present

## 2019-04-21 DIAGNOSIS — R29702 NIHSS score 2: Secondary | ICD-10-CM | POA: Diagnosis present

## 2019-04-21 DIAGNOSIS — Z79899 Other long term (current) drug therapy: Secondary | ICD-10-CM | POA: Diagnosis not present

## 2019-04-21 DIAGNOSIS — I5189 Other ill-defined heart diseases: Secondary | ICD-10-CM

## 2019-04-21 DIAGNOSIS — I4891 Unspecified atrial fibrillation: Secondary | ICD-10-CM | POA: Diagnosis present

## 2019-04-21 DIAGNOSIS — G3184 Mild cognitive impairment, so stated: Secondary | ICD-10-CM | POA: Diagnosis not present

## 2019-04-21 DIAGNOSIS — I6523 Occlusion and stenosis of bilateral carotid arteries: Secondary | ICD-10-CM | POA: Diagnosis present

## 2019-04-21 DIAGNOSIS — R4701 Aphasia: Secondary | ICD-10-CM | POA: Diagnosis not present

## 2019-04-21 DIAGNOSIS — I639 Cerebral infarction, unspecified: Secondary | ICD-10-CM | POA: Diagnosis present

## 2019-04-21 DIAGNOSIS — Z7982 Long term (current) use of aspirin: Secondary | ICD-10-CM

## 2019-04-21 DIAGNOSIS — Z7901 Long term (current) use of anticoagulants: Secondary | ICD-10-CM | POA: Diagnosis not present

## 2019-04-21 DIAGNOSIS — Z823 Family history of stroke: Secondary | ICD-10-CM | POA: Diagnosis not present

## 2019-04-21 DIAGNOSIS — I350 Nonrheumatic aortic (valve) stenosis: Secondary | ICD-10-CM | POA: Diagnosis present

## 2019-04-21 DIAGNOSIS — F419 Anxiety disorder, unspecified: Secondary | ICD-10-CM | POA: Diagnosis present

## 2019-04-21 DIAGNOSIS — K219 Gastro-esophageal reflux disease without esophagitis: Secondary | ICD-10-CM | POA: Diagnosis present

## 2019-04-21 DIAGNOSIS — R4781 Slurred speech: Secondary | ICD-10-CM

## 2019-04-21 DIAGNOSIS — N183 Chronic kidney disease, stage 3 unspecified: Secondary | ICD-10-CM | POA: Diagnosis present

## 2019-04-21 DIAGNOSIS — Z87891 Personal history of nicotine dependence: Secondary | ICD-10-CM

## 2019-04-21 DIAGNOSIS — Z8249 Family history of ischemic heart disease and other diseases of the circulatory system: Secondary | ICD-10-CM | POA: Diagnosis not present

## 2019-04-21 DIAGNOSIS — N179 Acute kidney failure, unspecified: Secondary | ICD-10-CM | POA: Diagnosis present

## 2019-04-21 DIAGNOSIS — Z792 Long term (current) use of antibiotics: Secondary | ICD-10-CM

## 2019-04-21 DIAGNOSIS — Z20822 Contact with and (suspected) exposure to covid-19: Secondary | ICD-10-CM | POA: Diagnosis present

## 2019-04-21 DIAGNOSIS — R413 Other amnesia: Secondary | ICD-10-CM | POA: Diagnosis present

## 2019-04-21 DIAGNOSIS — I6340 Cerebral infarction due to embolism of unspecified cerebral artery: Secondary | ICD-10-CM | POA: Diagnosis present

## 2019-04-21 DIAGNOSIS — I361 Nonrheumatic tricuspid (valve) insufficiency: Secondary | ICD-10-CM | POA: Diagnosis not present

## 2019-04-21 LAB — CBC WITH DIFFERENTIAL/PLATELET
Abs Immature Granulocytes: 0.03 10*3/uL (ref 0.00–0.07)
Basophils Absolute: 0 10*3/uL (ref 0.0–0.1)
Basophils Relative: 0 %
Eosinophils Absolute: 0 10*3/uL (ref 0.0–0.5)
Eosinophils Relative: 1 %
HCT: 36.2 % (ref 36.0–46.0)
Hemoglobin: 11.6 g/dL — ABNORMAL LOW (ref 12.0–15.0)
Immature Granulocytes: 1 %
Lymphocytes Relative: 24 %
Lymphs Abs: 1.1 10*3/uL (ref 0.7–4.0)
MCH: 33 pg (ref 26.0–34.0)
MCHC: 32 g/dL (ref 30.0–36.0)
MCV: 102.8 fL — ABNORMAL HIGH (ref 80.0–100.0)
Monocytes Absolute: 0.5 10*3/uL (ref 0.1–1.0)
Monocytes Relative: 11 %
Neutro Abs: 2.9 10*3/uL (ref 1.7–7.7)
Neutrophils Relative %: 63 %
Platelets: 204 10*3/uL (ref 150–400)
RBC: 3.52 MIL/uL — ABNORMAL LOW (ref 3.87–5.11)
RDW: 12.2 % (ref 11.5–15.5)
WBC: 4.5 10*3/uL (ref 4.0–10.5)
nRBC: 0 % (ref 0.0–0.2)

## 2019-04-21 LAB — COMPREHENSIVE METABOLIC PANEL
ALT: 12 U/L (ref 0–44)
AST: 16 U/L (ref 15–41)
Albumin: 3.3 g/dL — ABNORMAL LOW (ref 3.5–5.0)
Alkaline Phosphatase: 51 U/L (ref 38–126)
Anion gap: 9 (ref 5–15)
BUN: 20 mg/dL (ref 8–23)
CO2: 28 mmol/L (ref 22–32)
Calcium: 8 mg/dL — ABNORMAL LOW (ref 8.9–10.3)
Chloride: 105 mmol/L (ref 98–111)
Creatinine, Ser: 1.99 mg/dL — ABNORMAL HIGH (ref 0.44–1.00)
GFR calc Af Amer: 25 mL/min — ABNORMAL LOW (ref >60)
GFR calc non Af Amer: 22 mL/min — ABNORMAL LOW (ref >60)
Glucose, Bld: 117 mg/dL — ABNORMAL HIGH (ref 70–99)
Potassium: 4.3 mmol/L (ref 3.5–5.1)
Sodium: 142 mmol/L (ref 135–145)
Total Bilirubin: 1 mg/dL (ref 0.3–1.2)
Total Protein: 5.9 g/dL — ABNORMAL LOW (ref 6.5–8.1)

## 2019-04-21 LAB — URINALYSIS, ROUTINE W REFLEX MICROSCOPIC
Bilirubin Urine: NEGATIVE
Glucose, UA: NEGATIVE mg/dL
Hgb urine dipstick: NEGATIVE
Ketones, ur: NEGATIVE mg/dL
Nitrite: POSITIVE — AB
Protein, ur: NEGATIVE mg/dL
Specific Gravity, Urine: 1.018 (ref 1.005–1.030)
pH: 5 (ref 5.0–8.0)

## 2019-04-21 LAB — SARS CORONAVIRUS 2 (TAT 6-24 HRS): SARS Coronavirus 2: NEGATIVE

## 2019-04-21 LAB — TSH: TSH: 1.79 u[IU]/mL (ref 0.350–4.500)

## 2019-04-21 LAB — MAGNESIUM: Magnesium: 1.5 mg/dL — ABNORMAL LOW (ref 1.7–2.4)

## 2019-04-21 MED ORDER — STROKE: EARLY STAGES OF RECOVERY BOOK: Freq: Once | Status: DC

## 2019-04-21 MED ORDER — ATORVASTATIN CALCIUM 10 MG PO TABS
10.0000 mg | ORAL_TABLET | Freq: Every day | ORAL | Status: DC
Start: 2019-04-21 — End: 2019-04-22
  Administered 2019-04-21 – 2019-04-22 (×2): 10 mg via ORAL
  Filled 2019-04-21 (×2): qty 1

## 2019-04-21 MED ORDER — ACETAMINOPHEN 325 MG PO TABS: 650.0000 mg | ORAL_TABLET | ORAL | Status: DC | PRN

## 2019-04-21 MED ORDER — RIVAROXABAN 15 MG PO TABS
15.0000 mg | ORAL_TABLET | Freq: Every day | ORAL | Status: DC
  Administered 2019-04-21 – 2019-04-22 (×2): 15 mg via ORAL
  Filled 2019-04-21 (×3): qty 1

## 2019-04-21 MED ORDER — AMIODARONE HCL 200 MG PO TABS
200.0000 mg | ORAL_TABLET | Freq: Every day | ORAL | Status: DC
  Administered 2019-04-21 – 2019-04-23 (×3): 200 mg via ORAL
  Filled 2019-04-21 (×3): qty 1

## 2019-04-21 MED ORDER — PANTOPRAZOLE SODIUM 40 MG PO TBEC
40.0000 mg | DELAYED_RELEASE_TABLET | Freq: Every day | ORAL | Status: DC
  Administered 2019-04-21 – 2019-04-23 (×3): 40 mg via ORAL
  Filled 2019-04-21 (×3): qty 1

## 2019-04-21 MED ORDER — ACETAMINOPHEN 160 MG/5ML PO SOLN: 650.0000 mg | ORAL | Status: DC | PRN

## 2019-04-21 MED ORDER — ACETAMINOPHEN 650 MG RE SUPP: 650.0000 mg | RECTAL | Status: DC | PRN

## 2019-04-21 MED ORDER — SENNOSIDES-DOCUSATE SODIUM 8.6-50 MG PO TABS: 1.0000 | ORAL_TABLET | Freq: Every evening | ORAL | Status: DC | PRN

## 2019-04-21 MED ORDER — MAGNESIUM SULFATE 4 GM/100ML IV SOLN
4.0000 g | Freq: Once | INTRAVENOUS | Status: AC
  Administered 2019-04-21: 4 g via INTRAVENOUS
  Filled 2019-04-21: qty 100

## 2019-04-21 MED ORDER — LACTATED RINGERS IV SOLN: INTRAVENOUS | Status: AC

## 2019-04-21 NOTE — ED Provider Notes (Signed)
[facility] EMERGENCY DEPARTMENT Provider Note   CSN: [id number] Arrival date & time: 04/21/19  1128     History Chief Complaint  Patient presents with  . Aphasia    Christine Conley is a 86 y.o. female.  The history is provided by the patient, medical records and the EMS personnel. No language interpreter was used.  Neurologic Problem This is a new problem. The current episode started 6 to 12 hours ago. The problem occurs constantly. The problem has been gradually improving. Pertinent negatives include no chest pain, no abdominal pain, no headaches and no shortness of breath. Nothing aggravates the symptoms. Nothing relieves the symptoms. She has tried nothing for the symptoms. The treatment provided no relief.       Past Medical History:  Diagnosis Date  . Acid reflux   . Anxiety   . Atrial fibrillation ([place])   . Bilateral ovarian cysts   . Hypertension   . Memory loss    patient has been tested for dementia but was not diagnosed    Patient Active Problem List   Diagnosis Date Noted  . Chest tightness 08/19/2017  . Hypertension 08/19/2017  . Atrial fibrillation ([place]) 08/19/2017  . Anxiety 08/19/2017  . Acid reflux 08/19/2017  . Closed fracture of right proximal humerus 08/18/2017  . Fracture of humerus, proximal, right, closed 08/10/2017  . Memory loss 02/10/2017    Past Surgical History:  Procedure Laterality Date  . BILATERAL OPEN REDUCTION INTERNAL FIXATION (ORIF) PROXIMAL HUMERUS Right 08/18/2017   Procedure: RIGHT  OPEN REDUCTION INTERNAL FIXATION (ORIF) PROXIMAL HUMERUS;  Surgeon: Hiram Gash, MD;  Location: [facility];  Service: Orthopedics;  Laterality: Right;  . COLONOSCOPY    . ESOPHAGOGASTRODUODENOSCOPY    . EYE SURGERY     "to fix cross eyed"  . KNEE SURGERY Left    Replacement  . OVARIAN CYST SURGERY       OB History   No obstetric history on file.     Family History  Problem Relation Age of Onset  . Other Mother        died  at 41 - old age  . Heart disease Father        died at 40    Social History   Tobacco Use  . Smoking status: Former Research scientist (life sciences)  . Smokeless tobacco: Never Used  Substance Use Topics  . Alcohol use: No  . Drug use: No    Home Medications Prior to Admission medications   Medication Sig Start Date End Date Taking? Authorizing Provider  amiodarone (PACERONE) 200 MG tablet Take 1 tablet (200 mg total) by mouth daily. 10/27/16   Gareth Morgan, MD  atorvastatin (LIPITOR) 10 MG tablet Take 10 mg by mouth daily. 07/03/17   [provider]  ciprofloxacin (CIPRO) 500 MG tablet Take 1 tablet (500 mg total) by mouth 2 (two) times daily. One po bid x 7 days Patient not taking: Reported on 08/11/2017 03/30/17   Julianne Rice, MD  furosemide (LASIX) 20 MG tablet Take 20 mg by mouth daily. 03/15/17   [provider]  metoprolol succinate (TOPROL-XL) 50 MG 24 hr tablet Take 1 tablet (50 mg total) by mouth daily. 11/13/16   Jola Schmidt, MD  metroNIDAZOLE (FLAGYL) 500 MG tablet Take 1 tablet (500 mg total) by mouth 2 (two) times daily. One po bid x 7 days Patient not taking: Reported on 08/11/2017 03/30/17   Julianne Rice, MD  pantoprazole (PROTONIX) 40 MG tablet Take  40 mg by mouth 2 (two) times daily.  08/10/16   [provider]  PROCTO-MED HC 2.5 % rectal cream Place 1 application rectally 2 (two) times daily as needed. 07/07/17   [provider]  Rivaroxaban (XARELTO) 15 MG TABS tablet Take 1 tablet (15 mg total) by mouth daily with supper. 10/27/16   Gareth Morgan, MD    Allergies    Aspirin, Novocain [procaine], Penicillins, Meloxicam, and Codeine  Review of Systems   Review of Systems  Constitutional: Negative for chills, fatigue and fever.  HENT: Negative for congestion.   Eyes: Negative for photophobia and visual disturbance.  Respiratory: Negative for cough, chest tightness, shortness of breath and wheezing.   Cardiovascular: Negative for chest  pain.  Gastrointestinal: Negative for abdominal pain, diarrhea, nausea and vomiting.  Genitourinary: Negative for dysuria and flank pain.  Musculoskeletal: Negative for back pain, neck pain and neck stiffness.  Neurological: Positive for speech difficulty. Negative for dizziness, seizures, syncope, weakness, light-headedness and headaches.  Psychiatric/Behavioral: Negative for agitation.  All other systems reviewed and are negative.   Physical Exam Updated Vital Signs BP (!) 143/64 (BP Location: Right Arm)   Pulse (!) 58   Temp 98.1 F (36.7 C) (Oral)   Resp (!) 27   SpO2 98%   Physical Exam Vitals and nursing note reviewed.  Constitutional:      General: She is not in acute distress.    Appearance: She is well-developed. She is not ill-appearing, toxic-appearing or diaphoretic.  HENT:     Head: Normocephalic and atraumatic.     Right Ear: External ear normal.     Left Ear: External ear normal.     Nose: Nose normal. No congestion or rhinorrhea.     Mouth/Throat:     Mouth: Mucous membranes are moist.     Pharynx: No oropharyngeal exudate or posterior oropharyngeal erythema.  Eyes:     Extraocular Movements: Extraocular movements intact.     Conjunctiva/sclera: Conjunctivae normal.     Pupils: Pupils are equal, round, and reactive to light.  Cardiovascular:     Rate and Rhythm: Normal rate.     Pulses: Normal pulses.     Heart sounds: No murmur.  Pulmonary:     Effort: No respiratory distress.     Breath sounds: No stridor. No wheezing, rhonchi or rales.  Chest:     Chest wall: No tenderness.  Abdominal:     General: Abdomen is flat. There is no distension.     Tenderness: There is no abdominal tenderness. There is no right CVA tenderness, left CVA tenderness or rebound.  Musculoskeletal:        General: No tenderness or signs of injury.     Cervical back: Normal range of motion and neck supple. No tenderness.     Right lower leg: No edema.     Left lower leg: No  edema.  Skin:    General: Skin is warm.     Capillary Refill: Capillary refill takes less than 2 seconds.     Findings: No erythema or rash.  Neurological:     General: No focal deficit present.     Mental Status: She is alert and oriented to person, place, and time.     Cranial Nerves: No cranial nerve deficit.     Sensory: No sensory deficit.     Motor: No weakness or abnormal muscle tone.     Coordination: Coordination normal.     Deep Tendon Reflexes: Reflexes are  normal and symmetric.  Psychiatric:        Mood and Affect: Mood normal.     ED Results / Procedures / Treatments   Labs (all labs ordered are listed, but only abnormal results are displayed) Labs Reviewed  CBC WITH DIFFERENTIAL/PLATELET - Abnormal; Notable for the following components:      Result Value   RBC 3.52 (*)    Hemoglobin 11.6 (*)    MCV 102.8 (*)    All other components within normal limits  COMPREHENSIVE METABOLIC PANEL - Abnormal; Notable for the following components:   Glucose, Bld 117 (*)    Creatinine, Ser 1.99 (*)    Calcium 8.0 (*)    Total Protein 5.9 (*)    Albumin 3.3 (*)    GFR calc non Af Amer 22 (*)    GFR calc Af Amer 25 (*)    All other components within normal limits  MAGNESIUM - Abnormal; Notable for the following components:   Magnesium 1.5 (*)    All other components within normal limits  URINE CULTURE  SARS CORONAVIRUS 2 (TAT 6-24 HRS)  TSH  URINALYSIS, ROUTINE W REFLEX MICROSCOPIC    EKG EKG Interpretation  Date/Time:  Friday April 21 2019 11:43:31 EST Ventricular Rate:  56 PR Interval:    QRS Duration: 100 QT Interval:  456 QTC Calculation: 441 R Axis:   49 Text Interpretation: Sinus rhythm Abnormal R-wave progression, early transition Nonspecific T abnormalities, lateral leads When comapred to prior no significant changes seen. No STEMI Confirmed by Antony Blackbird [phone]) on 04/21/2019 11:58:10 AM   Radiology MR BRAIN WO CONTRAST  Result Date:  04/21/2019 CLINICAL DATA:  Speech difficulty. EXAM: MRI HEAD WITHOUT CONTRAST TECHNIQUE: Multiplanar, multiecho pulse sequences of the brain and surrounding structures were obtained without intravenous contrast. COMPARISON:  Report from head CT 08/11/2002 (images unavailable). FINDINGS: Brain: There is a small cortical curvilinear focus of restricted diffusion within the anterior left frontal lobe consistent with acute infarct (series 5, image 78). There is an additional punctate acute infarct more posteriorly within the left frontal lobe along the anterior bank of the left precentral gyrus (series 5, image 85). No evidence of intracranial mass. No midline shift or extra-axial fluid collection. Chronic microhemorrhage within the inferior left cerebellum. Small chronic lacunar infarcts within the thalami and cerebellum. Background of otherwise minimal chronic small vessel ischemic disease. Mild generalized parenchymal atrophy. Vascular: Flow voids maintained within the proximal large arterial vessels. Skull and upper cervical spine: No focal marrow lesion. Incompletely assessed upper cervical spondylosis. Sinuses/Orbits: Bilateral lens replacements. Moderate left sphenoid sinus mucosal thickening. Otherwise, no more than mild scattered paranasal sinus mucosal thickening. Small bilateral mastoid effusions. IMPRESSION: Two small acute left frontal lobe cortical infarcts as described. Mild generalized parenchymal atrophy and chronic small vessel ischemic disease. Small chronic lacunar infarcts within the thalami and cerebellum. Left sphenoid sinusitis. Small bilateral mastoid effusions Electronically Signed   By: Kellie Simmering DO   On: 04/21/2019 13:47    Procedures Procedures (including critical care time)  Medications Ordered in ED Medications  amiodarone (PACERONE) tablet 200 mg (has no administration in time range)  atorvastatin (LIPITOR) tablet 10 mg (has no administration in time range)  pantoprazole  (PROTONIX) EC tablet 40 mg (has no administration in time range)  Rivaroxaban (XARELTO) tablet 15 mg (has no administration in time range)   stroke: mapping our early stages of recovery book (has no administration in time range)  acetaminophen (TYLENOL) tablet 650 mg (has no  administration in time range)    Or  acetaminophen (TYLENOL) 160 MG/5ML solution 650 mg (has no administration in time range)    Or  acetaminophen (TYLENOL) suppository 650 mg (has no administration in time range)  senna-docusate (Senokot-S) tablet 1 tablet (has no administration in time range)    ED Course  I have reviewed the triage vital signs and the nursing notes.  Pertinent labs & imaging results that were available during my care of the patient were reviewed by me and considered in my medical decision making (see chart for details).    MDM Rules/Calculators/A&P                      Christine Conley is a 86 y.o. female with a past medical history significant for atrial fibrillation on Xarelto therapy, hypertension, anxiety, acid reflux, and some memory loss who presents with speech difficulty.  Patient was last normal when speaking with family around 4 PM yesterday.  She reports shoulder this morning at 10 AM and noticed she was having difficulty speaking and slurred speech.  She spoke to her son who agreed with the speech abnormality prompting EMS evaluation.  According to EMS, during transport her speech is gradually improved but patient still reports it is not her baseline.  She denies any vision changes, numbness, tingling, of arms or legs.  She denies any facial droop.  She denies any headache, neck pain, neck stiffness.  No recent trauma.  She denies fevers, chills, urinary symptoms or GI symptoms.  She denies any chest pain, palpitations, or shortness of breath.  No other symptoms.  On exam, patient had some possible stuttering and slow speech when answering questions but was able to communicate her thoughts  clearly.  I did not detect dysarthria although EMS reported that he felt she had some.  Patient had symmetric pupils and normal extraocular movements.  Normal finger-nose-finger testing bilaterally.  Normal sensation and strength in extremities.  Good pulses in all extremities.  Lungs clear and chest is nontender.  Neck is nontender, back is nontender.  Patient resting comfortably.  Clinically I am more concerned about TIA versus stroke given the patient's symptoms and history of A. fib.  Patient assures me that she think she has taken her medicine as directed in regards to her Xarelto.  She denies any other preceding symptoms.  As no headache or trauma  she has and her blood pressures in the 140s, have a lower suspicion for a hemorrhagic type stroke.  As she still is having some very mild symptoms, will get MRI to look for stroke versus TIA.  Will get labs.  Anticipate touching base with neurology to help determine disposition.    4:34 PM MRI does show stroke.  Neurology recommended admission.  Medicine will admit for further work-up and management.  Neurology recommended CTA head and neck however radiology team called to report that her kidney function cannot tolerate this.  I canceled them.  Will defer further work-up to the admitting team.  Patient will be admitted for her stroke.   Final Clinical Impression(s) / ED Diagnoses Final diagnoses:  Cerebrovascular accident (CVA), unspecified mechanism ([place])  Aphasia  Slurred speech    Rx / DC Orders ED Discharge Orders    None     Clinical Impression: 1. Cerebrovascular accident (CVA), unspecified mechanism ([place])   2. Aphasia   3. Slurred speech     Disposition: Admit  This note was prepared with assistance of  [employer] forces training and education officer. Occasional wrong-word or sound-a-like substitutions may have occurred due to the inherent limitations of voice recognition software.     Breindy Meadow, Gwenyth Allegra, MD 04/21/19 [phone]

## 2019-04-21 NOTE — ED Triage Notes (Signed)
Pt BIB [facility] from the [facility]. Per EMS pt LSW 1600 yesterday afternoon. Pt woke up this mornign around 1000 and noticed slurred speech. Pt son also stated that he noticed the slurred speech as well. Upon arrival to emergency department symptoms have resolved. Pt was able to ambulate fine to stretcher with EMS. Pt does take blood thinners. VSS. NAD.

## 2019-04-21 NOTE — Consult Note (Addendum)
NEURO HOSPITALIST  CONSULT   Requesting Physician: Dr. Sherry Ruffing    Chief Complaint: slurred speech  History obtained from:  Patient  HPI:                                                                                                                                         Christine Conley is an 86 y.o. female  With PMH HTN, A fib ( xarelto), dementia who presented to [facility] for slurred speech.    Patient lives at [facility]. Patient went to bed last night about 2130 and was normal. When she woke up this morning she noticed slurred speech. Pt. Son stated that he noticed slurred speech as well. Patient does stated she does not know who called EMS. Patient denies any vision changes, SOB, HA, weakness. Denies taking daily ASA and doesn't think she is on any blood thinning medications. Per chart she takes xarelto.  ED course:  MRI: two small acute left frontal lobe cortical infarcts BP140/46  BG: 117    Modified Rankin: Rankin Score=1  NIHSS:2; slurred speech, sensory     Past Medical History:  Diagnosis Date  . Acid reflux   . Anxiety   . Atrial fibrillation ([place])   . Bilateral ovarian cysts   . Hypertension   . Memory loss    patient has been tested for dementia but was not diagnosed    Past Surgical History:  Procedure Laterality Date  . BILATERAL OPEN REDUCTION INTERNAL FIXATION (ORIF) PROXIMAL HUMERUS Right 08/18/2017   Procedure: RIGHT  OPEN REDUCTION INTERNAL FIXATION (ORIF) PROXIMAL HUMERUS;  Surgeon: Hiram Gash, MD;  Location: [facility];  Service: Orthopedics;  Laterality: Right;  . COLONOSCOPY    . ESOPHAGOGASTRODUODENOSCOPY    . EYE SURGERY     "to fix cross eyed"  . KNEE SURGERY Left    Replacement  . OVARIAN CYST SURGERY      Family History  Problem Relation Age of Onset  . Other Mother        died at 51 - old age  . Heart disease Father        died at 50        Social History:  reports that she has quit  smoking. She has never used smokeless tobacco. She reports that she does not drink alcohol or use drugs.  Allergies:  Allergies  Allergen Reactions  . Aspirin Palpitations    PVC's  . Novocain [Procaine] Palpitations  . Penicillins Other (See Comments)    Localized mass, 'size of grapefruit' PATIENT HAS HAD A PCN REACTION WITH IMMEDIATE RASH, FACIAL/TONGUE/THROAT SWELLING, SOB, OR  LIGHTHEADEDNESS WITH HYPOTENSION:  #  #  YES  #  #  Has patient had a PCN reaction causing severe rash involving mucus membranes or skin necrosis: No Has patient had a PCN reaction that required hospitalization: No Has patient had a PCN reaction occurring within the last 10 years: No  . Meloxicam     UNSPECIFIED REACTION   . Codeine Other (See Comments)    hallucinations    Medications:                                                                                                                           No current facility-administered medications for this encounter.   Current Outpatient Medications  Medication Sig Dispense Refill  . amiodarone (PACERONE) 200 MG tablet Take 1 tablet (200 mg total) by mouth daily. 60 tablet 0  . atorvastatin (LIPITOR) 10 MG tablet Take 10 mg by mouth daily.  3  . ciprofloxacin (CIPRO) 500 MG tablet Take 1 tablet (500 mg total) by mouth 2 (two) times daily. One po bid x 7 days (Patient not taking: Reported on 08/11/2017) 14 tablet 0  . furosemide (LASIX) 20 MG tablet Take 20 mg by mouth daily.  3  . metoprolol succinate (TOPROL-XL) 50 MG 24 hr tablet Take 1 tablet (50 mg total) by mouth daily. 30 tablet 0  . metroNIDAZOLE (FLAGYL) 500 MG tablet Take 1 tablet (500 mg total) by mouth 2 (two) times daily. One po bid x 7 days (Patient not taking: Reported on 08/11/2017) 14 tablet 0  . pantoprazole (PROTONIX) 40 MG tablet Take 40 mg by mouth 2 (two) times daily.   0  . PROCTO-MED HC 2.5 % rectal cream Place 1 application rectally 2 (two) times daily as needed.  1  . Rivaroxaban  (XARELTO) 15 MG TABS tablet Take 1 tablet (15 mg total) by mouth daily with supper. 42 tablet 0     ROS:                                                                                                                                       ROS was performed and is negative except as noted in HPI    General Examination:  Blood pressure (!) 140/46, pulse (!) 56, temperature 98.1 F (36.7 C), temperature source Oral, resp. rate (!) 21, SpO2 98 %.  Physical Exam  Constitutional: Appears well-developed and well-nourished.  Psych: Affect appropriate to situation Eyes: Normal external eye and conjunctiva. HENT: Normocephalic, no lesions, without obvious abnormality.   Musculoskeletal-no joint tenderness, deformity or swelling Cardiovascular: Normal rate and regular rhythm.  Respiratory: Effort normal, non-labored breathing saturations WNL GI: Soft.  No distension. There is no tenderness.  Skin: WDI  Neurological Examination Mental Status: Alert, oriented, thought content appropriate. Some lapses in memory. Speech fluent without evidence of aphasia.  Able to follow  commands without difficulty. Cranial Nerves: II:; Visual fields grossly normal,  III,IV, VI: ptosis not present, extra-ocular motions intact bilaterally, pupils equal, round, reactive to light and accommodation V,VII: smile symmetric, facial light touch sensation normal bilaterally VIII: hearing normal bilaterally IX,X: uvula rises midline XI: bilateral shoulder shrug XII: midline tongue extension Motor: Right : Upper extremity   5/5  Left:     Upper extremity   5/5  Lower extremity   5/5   Lower extremity   5/5 Tone and bulk:normal tone throughout; no atrophy noted Sensory: decreased to LT LUE Plantars: Right: downgoing   Left: downgoing Cerebellar: No ataxia noted Gait: deferred   Lab Results: Basic Metabolic  Panel: Recent Labs  Lab 04/21/19 1233  NA 142  K 4.3  CL 105  CO2 28  GLUCOSE 117*  BUN 20  CREATININE 1.99*  CALCIUM 8.0*  MG 1.5*    CBC: Recent Labs  Lab 04/21/19 1233  WBC 4.5  NEUTROABS 2.9  HGB 11.6*  HCT 36.2  MCV 102.8*  PLT 204    Imaging: MR BRAIN WO CONTRAST  Result Date: 04/21/2019 CLINICAL DATA:  Speech difficulty. EXAM: MRI HEAD WITHOUT CONTRAST TECHNIQUE: Multiplanar, multiecho pulse sequences of the brain and surrounding structures were obtained without intravenous contrast. COMPARISON:  Report from head CT 08/11/2002 (images unavailable). FINDINGS: Brain: There is a small cortical curvilinear focus of restricted diffusion within the anterior left frontal lobe consistent with acute infarct (series 5, image 78). There is an additional punctate acute infarct more posteriorly within the left frontal lobe along the anterior bank of the left precentral gyrus (series 5, image 85). No evidence of intracranial mass. No midline shift or extra-axial fluid collection. Chronic microhemorrhage within the inferior left cerebellum. Small chronic lacunar infarcts within the thalami and cerebellum. Background of otherwise minimal chronic small vessel ischemic disease. Mild generalized parenchymal atrophy. Vascular: Flow voids maintained within the proximal large arterial vessels. Skull and upper cervical spine: No focal marrow lesion. Incompletely assessed upper cervical spondylosis. Sinuses/Orbits: Bilateral lens replacements. Moderate left sphenoid sinus mucosal thickening. Otherwise, no more than mild scattered paranasal sinus mucosal thickening. Small bilateral mastoid effusions. IMPRESSION: Two small acute left frontal lobe cortical infarcts as described. Mild generalized parenchymal atrophy and chronic small vessel ischemic disease. Small chronic lacunar infarcts within the thalami and cerebellum. Left sphenoid sinusitis. Small bilateral mastoid effusions Electronically Signed   By:  Kellie Simmering DO   On: 04/21/2019 13:47       Laurey Morale, MSN, NP-C [facility] [phone]  04/21/2019, 3:03 PM   Attending physician note to follow with Assessment and plan .   Assessment: 86 y.o. female with HTN and a.fib ( on xarelto), presented [facility] ED with c/o slurred speech. On exam patient did also have RUE decrease to sensation. MRI revealed two acute infarcts left frontal lobe. Admit for  full stroke work-up.  Likley cardioembolic. Consider switching DOAC Stroke Risk Factors - atrial fibrillation, hypercoagulable state and hypertension     Recommendations: --CTA head and neck --Echocardiogram --  Continue xarelto - consider switching to Eliquis. Will let stroke team finalize recs on DOAC after w/u completion -- High intensity Statin if LDL > 70 -- HgbA1c, fasting lipid panel -- PT consult, OT consult, Speech consult --Telemetry monitoring --Frequent neuro checks --Stroke swallow screen  --please page stroke NP  Or  PA  Or MD from 8am -4 pm  as this patient from this time will be  followed by the stroke.   You can look them up on [email or web address]  Password [email or web address]  CRITICAL CARE ATTESTATION Performed by: Amie Portland, MD Total critical care time: 45 minutes Critical care time was exclusive of separately billable procedures and treating other patients and/or supervising APPs/Residents/Students Critical care was necessary to treat or prevent imminent or life-threatening deterioration due to acute ischemic stroke  This patient is critically ill and at significant risk for neurological worsening and/or death and care requires constant monitoring. Critical care was time spent personally by me on the following activities: development of treatment plan with patient and/or surrogate as well as nursing, discussions with consultants, evaluation of patient's response to treatment, examination of patient, obtaining history from patient or surrogate, ordering and  performing treatments and interventions, ordering and review of laboratory studies, ordering and review of radiographic studies, pulse oximetry, re-evaluation of patient's condition, participation in multidisciplinary rounds and medical decision making of high complexity in the care of this patient.

## 2019-04-21 NOTE — ED Notes (Signed)
[place], [place] [phone]    Called for pt update.

## 2019-04-21 NOTE — H&P (Signed)
Date: 04/21/2019               Patient Name:  Christine Conley MRN: [id number]  DOB: 10-17-1928 Age / Sex: 86 y.o., female   PCP: [name], MD         Medical Service: Internal Medicine Teaching Service         Attending Physician: Dr. Lucious Groves, DO    First Contact: Dr. Ladona Horns Pager: [phone]  Second Contact: Dr. Modena Nunnery Pager: [phone]       After Hours (After 5p/  First Contact Pager: [phone]  weekends / holidays): Second Contact Pager: [phone]   Chief Complaint: slurred speech  History of Present Illness: Christine Conley is a 86 y.o female with atrial fibrillation on Xarelto, HTN, and MCI who presented to the ED with slurred speech. History from the patient was limited due to memory and was therefore primarily obtained through chart review and her son.   Patient is unclear exactly what happened for her to come to the ED. She remembers her son saying that she was talking different. She otherwise felt fine without any acute weakness or numbness. In hindsight she does recall having some slurred speech and difficulty concentrating but that has since resolved. She is unsure the exact time the symptoms started and resolved. She does not remember having any difficulty with getting the words out or choosing the right words.  She is unable to tell us about her past medical history and states that she does not take any medications daily. She lives in assisted living at the [facility]. She spends her days socializing and reading. She is able to perform all her own ADLs without assistance. She has two sons who live in [place]. She is a previous smoker, quitting > 20 years ago. Prior to that she smoked 1PPD for 17 years. She denies the use of EtOH or illicit substances.   Endorses headaches and SHOB. Denies visual changes, cough, CP, palpitations, focal weakness/numbness, abdominal pain, N/V, dysuria, hematuria, constipation, diarrhea, myalgias, arthralgias, AMS.  Meds:    No current facility-administered medications on file prior to encounter.   Current Outpatient Medications on File Prior to Encounter  Medication Sig Dispense Refill  . amiodarone (PACERONE) 200 MG tablet Take 1 tablet (200 mg total) by mouth daily. 60 tablet 0  . atorvastatin (LIPITOR) 10 MG tablet Take 10 mg by mouth daily.  3  . furosemide (LASIX) 20 MG tablet Take 20 mg by mouth daily.  3  . metoprolol succinate (TOPROL-XL) 50 MG 24 hr tablet Take 1 tablet (50 mg total) by mouth daily. 30 tablet 0  . pantoprazole (PROTONIX) 40 MG tablet Take 40 mg by mouth daily.   0  . potassium chloride (KLOR-CON) 10 MEQ tablet Take 10 mEq by mouth daily.    . Rivaroxaban (XARELTO) 15 MG TABS tablet Take 1 tablet (15 mg total) by mouth daily with supper. (Patient taking differently: Take 15 mg by mouth daily. ) 42 tablet 0  . spironolactone (ALDACTONE) 25 MG tablet Take 25 mg by mouth daily.    . ciprofloxacin (CIPRO) 500 MG tablet Take 1 tablet (500 mg total) by mouth 2 (two) times daily. One po bid x 7 days (Patient not taking: Reported on 08/11/2017) 14 tablet 0  . metroNIDAZOLE (FLAGYL) 500 MG tablet Take 1 tablet (500 mg total) by mouth 2 (two) times daily. One po bid x 7 days (Patient not taking: Reported on 08/11/2017) 14  tablet 0   Allergies: Allergies as of 04/21/2019 - Review Complete 04/21/2019  Allergen Reaction Noted  . Aspirin Palpitations 02/04/2011  . Novocain [procaine] Palpitations 08/11/2017  . Penicillins Other (See Comments) 02/04/2011  . Meloxicam  09/23/2016  . Codeine Other (See Comments) 02/04/2011   Past Medical History:  Diagnosis Date  . Acid reflux   . Anxiety   . Atrial fibrillation ([place])   . Bilateral ovarian cysts   . Hypertension   . Memory loss    patient has been tested for dementia but was not diagnosed   Family History: Twin brother with Lock jaw at age 28 y.o. Denies a family history of HTN, DM, HLD, Lung disease. Believes her mother had a CVA and her father  died from "Cardiac asthma."  Social History: She lives in assisted living at the [facility]. She spends her days socializing and reading. She has two sons who live in [place]. One son died. She is a previous smoker, quitting > 20 years ago. Prior to that she smoked 1PPD for 17 years. She denies the use of EtOH or illicit substances.   Review of Systems: A complete ROS was negative except as per HPI.   Physical Exam: Blood pressure (!) 141/51, pulse (!) 58, temperature 98.1 F (36.7 C), temperature source Oral, resp. rate 18, SpO2 98 %. Physical Exam Vitals and nursing note reviewed.  Constitutional:      General: She is not in acute distress.    Appearance: She is not ill-appearing.  HENT:     Head: Normocephalic and atraumatic.     Mouth/Throat:     Mouth: Mucous membranes are moist.  Eyes:     Extraocular Movements: Extraocular movements intact.  Cardiovascular:     Rate and Rhythm: Normal rate and regular rhythm.     Heart sounds: Murmur present. Systolic murmur present with a grade of 3/6.  Pulmonary:     Effort: Pulmonary effort is normal. No respiratory distress.     Breath sounds: Normal breath sounds.  Abdominal:     General: Abdomen is flat. Bowel sounds are normal.     Palpations: Abdomen is soft.     Tenderness: There is abdominal tenderness in the left lower quadrant. There is no guarding or rebound.  Musculoskeletal:        General: No deformity. Normal range of motion.     Right lower leg: No edema.     Left lower leg: No edema.  Skin:    General: Skin is warm and dry.  Neurological:     General: No focal deficit present.     Mental Status: She is alert.     Cranial Nerves: No facial asymmetry.     Sensory: Sensory deficit (decrease sensation to light touch of RUE) present.     Motor: Motor function is intact.     Comments: Patient is awake, alert, oriented x3. No signs of aphasia or neglect. EOMI without ptosis or diploplia. Facial sensation is symmetric  tolight touch Facial movement is symmetric. Hearing is intact to voice Shoulder shrug is symmetric. Full and symmetric strength bilaterally of upper and lower extremities.   Psychiatric:        Mood and Affect: Mood normal.        Speech: Speech normal.        Cognition and Memory: Memory is impaired.    EKG: personally reviewed my interpretation is sinus bradycardia with normal axis, borderline prolongation PR interval otherwise normal intervals. No acute  ST segment changes.   MR Brain without Contrast  Two small acute left frontal lobe cortical infarcts as described.  Mild generalized parenchymal atrophy and chronic small vessel ischemic disease. Small chronic lacunar infarcts within the thalami and cerebellum.  Left sphenoid sinusitis.  Small bilateral mastoid effusions  Assessment & Plan by Problem: Active Problems:   CVA (cerebral vascular accident) [facility])  Ms. Thompsen is a 86 y.o female with atrial fibrillation on Xarelto, HTN, and MCI who presented due to slurred speech and was found to have two acute L frontal infarct on MRI. Pt was admitted for further stroke work-up and management.  Stroke Pt presented after acute onset slurred speech this morning. Upon presentation to the ED, her speech had cleared though pt with persistent decreased sensation to light touch on the RUE - Permissive HTN up to pressures of 220/120 for the next 48 hours - Neurology consulted, appreciate recommendations - Creatinine elevated compared to baseline. Will hydrate overnight in hopes of getting CTA head and neck tomorrow  - Echo  - Continue Xarelto - A1c and lipid panel - Will increase dosage of atorvastatin if LDL remains above 70  - PT/OT/speech - Stroke swallow screen - Tele - q2 neuro checks  A fib Pt taking amiodarone 200mg  and metoprolol 50mg  daily for rate and rhythm control. On anticoagulation with Xarelto. Heart rates well controlled in the 60s. Rhythm regular on ausculation and  sinus on admission EKG. - continue Xarelto 15mg  PO daily. Unclear if patient has been taking this.  - continue amiodarone 200mg  daily - holding home metoprolol 50mg  daily  HTN - Allowing permissive HTN in setting of actue CVA  - Holding Metoprolol, spironolactone, and furosemide   AKI on CKD Stage III  - Creatinine up to 1.99 from baseline of 1.4  - Will hydrate overnight and recheck BMP in the morning - If creatinine remains elevated we will obtain urine studies and a renal US  Hypomagnesemia  - Replete with 4g Mag sulfate IV  Diet - NPO pending swallow eval Fluids - none DVT ppx - Xarelto 15mg  PO daily CODE STATUS - FULL CODE  Dispo: Admit patient to Inpatient with expected length of stay greater than 2 midnights.  Signed: Ladona Horns, MD 04/21/2019, 5:24 PM  Pager: [phone]

## 2019-04-21 NOTE — ED Notes (Addendum)
ED TO INPATIENT HANDOFF REPORT  ED Nurse Name and Phone #: Thurmond Butts [place] Name/Age/Gender Christine Conley 86 y.o. female Room/Bed: 039C/039C  Code Status   Code Status: Full Code  Home/SNF/Other Home Patient oriented to: self, place, time and situation Is this baseline? Yes   Triage Complete: Triage complete  Chief Complaint CVA (cerebral vascular accident) [facility]) [I63.9]  Triage Note Pt BIB [facility] from the [facility]. Per EMS pt LSW 1600 yesterday afternoon. Pt woke up this mornign around 1000 and noticed slurred speech. Pt son also stated that he noticed the slurred speech as well. Upon arrival to emergency department symptoms have resolved. Pt was able to ambulate fine to stretcher with EMS. Pt does take blood thinners. VSS. NAD.     Allergies Allergies  Allergen Reactions  . Aspirin Palpitations    PVC's  . Novocain [Procaine] Palpitations  . Penicillins Other (See Comments)    Localized mass, 'size of grapefruit' PATIENT HAS HAD A PCN REACTION WITH IMMEDIATE RASH, FACIAL/TONGUE/THROAT SWELLING, SOB, OR LIGHTHEADEDNESS WITH HYPOTENSION:  #  #  YES  #  #  Has patient had a PCN reaction causing severe rash involving mucus membranes or skin necrosis: No Has patient had a PCN reaction that required hospitalization: No Has patient had a PCN reaction occurring within the last 10 years: No  . Meloxicam     UNSPECIFIED REACTION   . Codeine Other (See Comments)    hallucinations    Level of Care/Admitting Diagnosis ED Disposition    ED Disposition Condition [facility] Area: [place] [[id number]]  Level of Care: Telemetry Medical [104]  Covid Evaluation: Asymptomatic Screening Protocol (No Symptoms)  [facility]: [id number]  Admitting Physician: Bosie Helper  Attending Physician: Lucious Groves [[id number]]  Estimated length of stay: past midnight tomorrow  Certification:: I certify this patient will  need inpatient services for at least 2 midnights       B Medical/Surgery History Past Medical History:  Diagnosis Date  . Acid reflux   . Anxiety   . Atrial fibrillation ([place])   . Bilateral ovarian cysts   . Hypertension   . Memory loss    patient has been tested for dementia but was not diagnosed   Past Surgical History:  Procedure Laterality Date  . BILATERAL OPEN REDUCTION INTERNAL FIXATION (ORIF) PROXIMAL HUMERUS Right 08/18/2017   Procedure: RIGHT  OPEN REDUCTION INTERNAL FIXATION (ORIF) PROXIMAL HUMERUS;  Surgeon: Hiram Gash, MD;  Location: [facility];  Service: Orthopedics;  Laterality: Right;  . COLONOSCOPY    . ESOPHAGOGASTRODUODENOSCOPY    . EYE SURGERY     "to fix cross eyed"  . KNEE SURGERY Left    Replacement  . OVARIAN CYST SURGERY       A IV Location/Drains/Wounds Patient Lines/Drains/Airways Status   Active Line/Drains/Airways    Name:   Placement date:   Placement time:   Site:   Days:   Peripheral IV 04/21/19 Left;Upper;Medial Arm   04/21/19    1242    Arm   less than 1   Peripheral IV 04/21/19 Left;Anterior;Upper Forearm   04/21/19    1607    Forearm   less than 1   External Urinary Catheter   04/21/19    1148    --   less than 1   Incision (Closed) 08/18/17 Shoulder Right   08/18/17    1509     611  Intake/Output Last 24 hours No intake or output data in the 24 hours ending 04/21/19 1611  Labs/Imaging Results for orders placed or performed during the hospital encounter of 04/21/19 (from the past 48 hour(s))  CBC with Differential     Status: Abnormal   Collection Time: 04/21/19 12:33 PM  Result Value Ref Range   WBC 4.5 4.0 - 10.5 K/uL   RBC 3.52 (L) 3.87 - 5.11 MIL/uL   Hemoglobin 11.6 (L) 12.0 - 15.0 g/dL   HCT 36.2 36.0 - 46.0 %   MCV 102.8 (H) 80.0 - 100.0 fL   MCH 33.0 26.0 - 34.0 pg   MCHC 32.0 30.0 - 36.0 g/dL   RDW 12.2 11.5 - 15.5 %   Platelets 204 150 - 400 K/uL   nRBC 0.0 0.0 - 0.2 %   Neutrophils Relative % 63 %    Neutro Abs 2.9 1.7 - 7.7 K/uL   Lymphocytes Relative 24 %   Lymphs Abs 1.1 0.7 - 4.0 K/uL   Monocytes Relative 11 %   Monocytes Absolute 0.5 0.1 - 1.0 K/uL   Eosinophils Relative 1 %   Eosinophils Absolute 0.0 0.0 - 0.5 K/uL   Basophils Relative 0 %   Basophils Absolute 0.0 0.0 - 0.1 K/uL   Immature Granulocytes 1 %   Abs Immature Granulocytes 0.03 0.00 - 0.07 K/uL    Comment: Performed at [facility], [address], [place], [place] [address]  Comprehensive metabolic panel     Status: Abnormal   Collection Time: 04/21/19 12:33 PM  Result Value Ref Range   Sodium 142 135 - 145 mmol/L   Potassium 4.3 3.5 - 5.1 mmol/L   Chloride 105 98 - 111 mmol/L   CO2 28 22 - 32 mmol/L   Glucose, Bld 117 (H) 70 - 99 mg/dL   BUN 20 8 - 23 mg/dL   Creatinine, Ser 1.99 (H) 0.44 - 1.00 mg/dL   Calcium 8.0 (L) 8.9 - 10.3 mg/dL   Total Protein 5.9 (L) 6.5 - 8.1 g/dL   Albumin 3.3 (L) 3.5 - 5.0 g/dL   AST 16 15 - 41 U/L   ALT 12 0 - 44 U/L   Alkaline Phosphatase 51 38 - 126 U/L   Total Bilirubin 1.0 0.3 - 1.2 mg/dL   GFR calc non Af Amer 22 (L) >60 mL/min   GFR calc Af Amer 25 (L) >60 mL/min   Anion gap 9 5 - 15    Comment: Performed at [facility], [place] [address], [place], [place] [address]  TSH     Status: None   Collection Time: 04/21/19 12:33 PM  Result Value Ref Range   TSH 1.790 0.350 - 4.500 uIU/mL    Comment: Performed by a 3rd Generation assay with a functional sensitivity of <=0.01 uIU/mL. Performed at [facility], [place] [address] [place], [place] [address]   Magnesium     Status: Abnormal   Collection Time: 04/21/19 12:33 PM  Result Value Ref Range   Magnesium 1.5 (L) 1.7 - 2.4 mg/dL    Comment: Performed at [facility] [address], [place], [place] [address]   MR BRAIN WO CONTRAST  Result Date: 04/21/2019 CLINICAL DATA:  Speech difficulty. EXAM: MRI HEAD WITHOUT CONTRAST TECHNIQUE: Multiplanar, multiecho pulse sequences of the brain and surrounding  structures were obtained without intravenous contrast. COMPARISON:  Report from head CT 08/11/2002 (images unavailable). FINDINGS: Brain: There is a small cortical curvilinear focus  of restricted diffusion within the anterior left frontal lobe consistent with acute infarct (series 5, image 78). There is an additional punctate acute infarct more posteriorly within the left frontal lobe along the anterior bank of the left precentral gyrus (series 5, image 85). No evidence of intracranial mass. No midline shift or extra-axial fluid collection. Chronic microhemorrhage within the inferior left cerebellum. Small chronic lacunar infarcts within the thalami and cerebellum. Background of otherwise minimal chronic small vessel ischemic disease. Mild generalized parenchymal atrophy. Vascular: Flow voids maintained within the proximal large arterial vessels. Skull and upper cervical spine: No focal marrow lesion. Incompletely assessed upper cervical spondylosis. Sinuses/Orbits: Bilateral lens replacements. Moderate left sphenoid sinus mucosal thickening. Otherwise, no more than mild scattered paranasal sinus mucosal thickening. Small bilateral mastoid effusions. IMPRESSION: Two small acute left frontal lobe cortical infarcts as described. Mild generalized parenchymal atrophy and chronic small vessel ischemic disease. Small chronic lacunar infarcts within the thalami and cerebellum. Left sphenoid sinusitis. Small bilateral mastoid effusions Electronically Signed   By: Kellie Simmering DO   On: 04/21/2019 13:47    Pending Labs Unresulted Labs (From admission, onward)    Start     Ordered   04/22/19 0500  Hemoglobin A1c  Tomorrow morning,   R     04/21/19 1538   04/22/19 0500  Lipid panel  Tomorrow morning,   R    Comments: Fasting    04/21/19 1538   04/21/19 1417  SARS CORONAVIRUS 2 (TAT 6-24 HRS) Nasopharyngeal Nasopharyngeal Swab  (Tier 3 (TAT 6-24 hrs))  Once,   STAT    Question Answer Comment  Is this test for  diagnosis or screening Screening   Symptomatic for COVID-19 as defined by CDC No   Hospitalized for COVID-19 No   Admitted to ICU for COVID-19 No   Previously tested for COVID-19 Unknown   Resident in a congregate (group) care setting No   Employed in healthcare setting No   Pregnant No      04/21/19 1417   04/21/19 1150  Urinalysis, Routine w reflex microscopic  Once,   STAT     04/21/19 1149   04/21/19 1150  Urine culture  ONCE - STAT,   STAT     04/21/19 1149          Vitals/Pain Today's Vitals   04/21/19 1415 04/21/19 1430 04/21/19 1445 04/21/19 1500  BP: 116/68 (!) 151/55 (!) 136/49 (!) 140/46  Pulse: (!) 55 (!) 59 (!) 57 (!) 56  Resp: 20 (!) 26 (!) 27 (!) 21  Temp:      TempSrc:      SpO2: 96% 99% 99% 98%  PainSc:        Isolation Precautions No active isolations  Medications Medications  amiodarone (PACERONE) tablet 200 mg (has no administration in time range)  atorvastatin (LIPITOR) tablet 10 mg (has no administration in time range)  pantoprazole (PROTONIX) EC tablet 40 mg (has no administration in time range)  Rivaroxaban (XARELTO) tablet 15 mg (has no administration in time range)   stroke: mapping our early stages of recovery book (has no administration in time range)  acetaminophen (TYLENOL) tablet 650 mg (has no administration in time range)    Or  acetaminophen (TYLENOL) 160 MG/5ML solution 650 mg (has no administration in time range)    Or  acetaminophen (TYLENOL) suppository 650 mg (has no administration in time range)  senna-docusate (Senokot-S) tablet 1 tablet (has no administration in time range)    Mobility walks Low  fall risk   Focused Assessments    R Recommendations: See Admitting Provider Note  Report given to: 2W RN  Additional Notes:

## 2019-04-22 ENCOUNTER — Inpatient Hospital Stay (HOSPITAL_COMMUNITY): Payer: Medicare Other

## 2019-04-22 DIAGNOSIS — Z79899 Other long term (current) drug therapy: Secondary | ICD-10-CM

## 2019-04-22 DIAGNOSIS — I4891 Unspecified atrial fibrillation: Secondary | ICD-10-CM

## 2019-04-22 DIAGNOSIS — I6523 Occlusion and stenosis of bilateral carotid arteries: Secondary | ICD-10-CM

## 2019-04-22 DIAGNOSIS — G3184 Mild cognitive impairment, so stated: Secondary | ICD-10-CM

## 2019-04-22 DIAGNOSIS — I5189 Other ill-defined heart diseases: Secondary | ICD-10-CM

## 2019-04-22 DIAGNOSIS — Z7901 Long term (current) use of anticoagulants: Secondary | ICD-10-CM

## 2019-04-22 DIAGNOSIS — I129 Hypertensive chronic kidney disease with stage 1 through stage 4 chronic kidney disease, or unspecified chronic kidney disease: Secondary | ICD-10-CM

## 2019-04-22 DIAGNOSIS — N183 Chronic kidney disease, stage 3 unspecified: Secondary | ICD-10-CM

## 2019-04-22 DIAGNOSIS — I6340 Cerebral infarction due to embolism of unspecified cerebral artery: Principal | ICD-10-CM

## 2019-04-22 DIAGNOSIS — N179 Acute kidney failure, unspecified: Secondary | ICD-10-CM

## 2019-04-22 DIAGNOSIS — I350 Nonrheumatic aortic (valve) stenosis: Secondary | ICD-10-CM

## 2019-04-22 DIAGNOSIS — I639 Cerebral infarction, unspecified: Secondary | ICD-10-CM

## 2019-04-22 DIAGNOSIS — I361 Nonrheumatic tricuspid (valve) insufficiency: Secondary | ICD-10-CM

## 2019-04-22 LAB — LIPID PANEL
Cholesterol: 159 mg/dL (ref 0–200)
HDL: 44 mg/dL (ref >40)
LDL Cholesterol: 94 mg/dL (ref 0–99)
Total CHOL/HDL Ratio: 3.6 RATIO
Triglycerides: 103 mg/dL (ref <150)
VLDL: 21 mg/dL (ref 0–40)

## 2019-04-22 LAB — BASIC METABOLIC PANEL
Anion gap: 10 (ref 5–15)
BUN: 20 mg/dL (ref 8–23)
CO2: 25 mmol/L (ref 22–32)
Calcium: 8.3 mg/dL — ABNORMAL LOW (ref 8.9–10.3)
Chloride: 104 mmol/L (ref 98–111)
Creatinine, Ser: 1.8 mg/dL — ABNORMAL HIGH (ref 0.44–1.00)
GFR calc Af Amer: 29 mL/min — ABNORMAL LOW (ref >60)
GFR calc non Af Amer: 25 mL/min — ABNORMAL LOW (ref >60)
Glucose, Bld: 106 mg/dL — ABNORMAL HIGH (ref 70–99)
Potassium: 4.9 mmol/L (ref 3.5–5.1)
Sodium: 139 mmol/L (ref 135–145)

## 2019-04-22 LAB — ECHOCARDIOGRAM COMPLETE

## 2019-04-22 LAB — HEMOGLOBIN A1C
Hgb A1c MFr Bld: 5.3 % (ref 4.8–5.6)
Mean Plasma Glucose: 105.41 mg/dL

## 2019-04-22 LAB — MAGNESIUM: Magnesium: 2.6 mg/dL — ABNORMAL HIGH (ref 1.7–2.4)

## 2019-04-22 MED ORDER — ATORVASTATIN CALCIUM 40 MG PO TABS
40.0000 mg | ORAL_TABLET | Freq: Every day | ORAL | Status: DC
  Administered 2019-04-23: 40 mg via ORAL
  Filled 2019-04-22: qty 1

## 2019-04-22 NOTE — Progress Notes (Addendum)
VASCULAR LAB PRELIMINARY  PRELIMINARY  PRELIMINARY  PRELIMINARY  Carotid duplex completed.    Preliminary report:  See CV proc for preliminary results.  Paged Teaching service at 18:40, regarding results, with no response  Taccara Bushnell, RVT 04/22/2019, 8:18 PM

## 2019-04-22 NOTE — Evaluation (Signed)
Speech Language Pathology Evaluation Patient Details Name: Christine Conley MRN: [id number] DOB: 1928/05/15 Today's Date: 04/22/2019 Time: 0950-1008 SLP Time Calculation (min) (ACUTE ONLY): 18 min  Problem List:  Patient Active Problem List   Diagnosis Date Noted  . CVA (cerebral vascular accident) ([place]) 04/21/2019  . Chest tightness 08/19/2017  . Hypertension 08/19/2017  . Atrial fibrillation ([place]) 08/19/2017  . Anxiety 08/19/2017  . Acid reflux 08/19/2017  . Closed fracture of right proximal humerus 08/18/2017  . Fracture of humerus, proximal, right, closed 08/10/2017  . Memory loss 02/10/2017   Past Medical History:  Past Medical History:  Diagnosis Date  . Acid reflux   . Anxiety   . Atrial fibrillation ([place])   . Bilateral ovarian cysts   . Hypertension   . Memory loss    patient has been tested for dementia but was not diagnosed   Past Surgical History:  Past Surgical History:  Procedure Laterality Date  . BILATERAL OPEN REDUCTION INTERNAL FIXATION (ORIF) PROXIMAL HUMERUS Right 08/18/2017   Procedure: RIGHT  OPEN REDUCTION INTERNAL FIXATION (ORIF) PROXIMAL HUMERUS;  Surgeon: Hiram Gash, MD;  Location: [facility];  Service: Orthopedics;  Laterality: Right;  . COLONOSCOPY    . ESOPHAGOGASTRODUODENOSCOPY    . EYE SURGERY     "to fix cross eyed"  . KNEE SURGERY Left    Replacement  . OVARIAN CYST SURGERY     HPI:  [name] is a 86 y.o female with atrial fibrillation, memory loss, GERD, HTN who presented to the ED with slurred speech. Per chart she lives in assisted living at the [facility]. MRI two small acute left frontal lobe cortical infarcts as described. Mild generalized parenchymal atrophy and chronic small vessel ischemic disease. Small chronic lacunar infarcts within the thalami and cerebellum.   Assessment / Plan / Recommendation Clinical Impression  Pt lives in independent living facility with daily check in's (via phone) from son (present for evaluation) to ensure  medicine is taken although pt/son deny difficulty in managing medications thus far. Son stated speech is back to baseline but altered yesterday precipitating 911 call. No word finding difficulties evident and pt stated she sometimes is hesitant in her words. No direct ST recommended at present. If she has cognitive concerns once back at facility, would recommend [place] [facility] eval if needed.      SLP Assessment  SLP Recommendation/Assessment: Patient does not need any further Speech Lanaguage Pathology Services    Follow Up Recommendations  None    Frequency and Duration           SLP Evaluation Cognition  Overall Cognitive Status: Impaired/Different from baseline Arousal/Alertness: Awake/alert Orientation Level: Oriented X4 Attention: Sustained Sustained Attention: Appears intact Memory: (recalled relevent info ) Awareness: Appears intact Problem Solving: (verbal- functional) Safety/Judgment: (appeared functional for basic)       Comprehension  Auditory Comprehension Overall Auditory Comprehension: Appears within functional limits for tasks assessed Visual Recognition/Discrimination Discrimination: Not tested Reading Comprehension Reading Status: Not tested    Expression Expression Primary Mode of Expression: Verbal Verbal Expression Overall Verbal Expression: Appears within functional limits for tasks assessed Pragmatics: No impairment Written Expression Written Expression: Not tested   Oral / Motor  Oral Motor/Sensory Function Overall Oral Motor/Sensory Function: Within functional limits Motor Speech Overall Motor Speech: Appears within functional limits for tasks assessed Intelligibility: Intelligible Motor Planning: Witnin functional limits   GO  Houston Siren 04/22/2019, 10:21 AM  Orbie Pyo Colvin Caroli.Ed Risk analyst [phone] Office [phone]

## 2019-04-22 NOTE — Plan of Care (Signed)

## 2019-04-22 NOTE — Evaluation (Signed)
Physical Therapy Evaluation Patient Details Name: Christine Conley MRN: [id number] DOB: 11-04-1928 Today's Date: 04/22/2019   History of Present Illness  86 y.o female with atrial fibrillation on Xarelto, HTN, and MCI who presented to the ED with slurred speech and AMS.  Clinical Impression  Pt presents to PT with deficits in cognition, gait, balance, endurance, and power, however pt and son report this is near her baseline. Pt is able to perform all functional mobility required in the home setting with use of a RW and no physical assistance requirements. Pt does have short term memory deficits, but has assistance available from staff at independent living facility, as well as her family, as needed. Pt will benefit from continued acute PT POC to improve balance and activity tolerance to further reduce falls risk. Pt will benefit from home health PT assessment to ensure a safe transition back to the home setting.    Follow Up Recommendations Home health PT;Supervision - Intermittent    Equipment Recommendations  None recommended by PT(pt owns necessary DME)    Recommendations for Other Services       Precautions / Restrictions Precautions Precautions: Fall Restrictions Weight Bearing Restrictions: No      Mobility  Bed Mobility Overal bed mobility: Modified Independent             General bed mobility comments: increased time  Transfers Overall transfer level: Needs assistance Equipment used: None Transfers: Sit to/from Stand Sit to Stand: Supervision            Ambulation/Gait Ambulation/Gait assistance: Supervision Gait Distance (Feet): 100 Feet Assistive device: Rolling walker (2 wheeled) Gait Pattern/deviations: Step-to pattern;Wide base of support Gait velocity: reduced Gait velocity interpretation: 1.31 - 2.62 ft/sec, indicative of limited community ambulator General Gait Details: pt with shortened step to gait, slow but steady  Chief Strategy Officer    Modified Rankin (Stroke Patients Only) Modified Rankin (Stroke Patients Only) Pre-Morbid Rankin Score: No significant disability Modified Rankin: No significant disability     Balance Overall balance assessment: Needs assistance Sitting-balance support: No upper extremity supported;Feet supported Sitting balance-Leahy Scale: Good Sitting balance - Comments: modI   Standing balance support: Bilateral upper extremity supported Standing balance-Leahy Scale: Good Standing balance comment: supervision with BUE support of RW                             Pertinent Vitals/Pain Pain Assessment: No/denies pain    Home Living Family/patient expects to be discharged to:: (Independent living facility) Living Arrangements: Alone Available Help at Discharge: Family Type of Home: Independent living facility                Prior Function Level of Independence: Independent with assistive device(s)         Comments: pt is independent with use of Rollator     Hand Dominance        Extremity/Trunk Assessment   Upper Extremity Assessment Upper Extremity Assessment: Overall WFL for tasks assessed    Lower Extremity Assessment Lower Extremity Assessment: Generalized weakness(grossly 4/5 bilaterally)    Cervical / Trunk Assessment Cervical / Trunk Assessment: Normal(pt with intermittent resting tremor in head/neck and arms)  Communication   Communication: No difficulties  Cognition Arousal/Alertness: Awake/alert Behavior During Therapy: WFL for tasks assessed/performed Overall Cognitive Status: History of cognitive impairments - at baseline  General Comments: pt with short term memory deficits at baseline      General Comments General comments (skin integrity, edema, etc.): VSS, pt with increased work of breathing during ambulation, son reports this is her baseline    Exercises      Assessment/Plan    PT Assessment Patient needs continued PT services  PT Problem List Decreased activity tolerance;Decreased balance;Decreased safety awareness;Decreased mobility       PT Treatment Interventions DME instruction;Gait training;Therapeutic activities;Functional mobility training;Therapeutic exercise;Balance training;Neuromuscular re-education;Patient/family education    PT Goals (Current goals can be found in the Care Plan section)  Acute Rehab PT Goals Patient Stated Goal: To return home PT Goal Formulation: With patient/family Time For Goal Achievement: 05/06/19 Potential to Achieve Goals: Good Additional Goals Additional Goal #1: Pt will maintain dynamic standing balance within 10 inches of her base of support without UE support, independently.    Frequency Min 4X/week   Barriers to discharge        Co-evaluation               AM-PAC PT "6 Clicks" Mobility  Outcome Measure Help needed turning from your back to your side while in a flat bed without using bedrails?: None Help needed moving from lying on your back to sitting on the side of a flat bed without using bedrails?: None Help needed moving to and from a bed to a chair (including a wheelchair)?: None Help needed standing up from a chair using your arms (e.g., wheelchair or bedside chair)?: None Help needed to walk in hospital room?: None Help needed climbing 3-5 steps with a railing? : A Little 6 Click Score: 23    End of Session Equipment Utilized During Treatment: (none) Activity Tolerance: Patient tolerated treatment well Patient left: in chair;with call bell/phone within reach;with family/visitor present Nurse Communication: Mobility status PT Visit Diagnosis: Other abnormalities of gait and mobility (R26.89)    Time: [id number] PT Time Calculation (min) (ACUTE ONLY): 24 min   Charges:   PT Evaluation $PT Eval Low Complexity: [place], PT, DPT Acute  Rehabilitation Pager: [phone]   [name] 04/22/2019, 1:49 PM

## 2019-04-22 NOTE — Evaluation (Signed)
Occupational Therapy Evaluation Patient Details Name: Christine Conley MRN: [id number] DOB: 29-Aug-1928 Today's Date: 04/22/2019    History of Present Illness 86 y.o female with atrial fibrillation on Xarelto, HTN, and MCI who presented to the ED with slurred speech and AMS. MRI showing Two small acute left frontal lobe cortical infarcts   Clinical Impression   PTA, pt was living at ALF and performed BADLs and used Rollator; family checking in regularly. Pt currently performing ADLs and functional mobility at [place] level with increased cues for ST memory deficits. VSS throughout on RA.  Pt would benefit from further acute OT to facilitate safe dc. Recommend dc to home with HHOT for further OT to optimize safety, independence with ADLs, and return to PLOF.      Follow Up Recommendations  Home health OT;Supervision - Intermittent    Equipment Recommendations  None recommended by OT    Recommendations for Other Services       Precautions / Restrictions Precautions Precautions: Fall Restrictions Weight Bearing Restrictions: No      Mobility Bed Mobility Overal bed mobility: Modified Independent             General bed mobility comments: increased time  Transfers Overall transfer level: Needs assistance Equipment used: Rolling walker (2 wheeled) Transfers: Sit to/from Stand Sit to Stand: Supervision         General transfer comment: Supervision for safety    Balance Overall balance assessment: Needs assistance Sitting-balance support: No upper extremity supported;Feet supported Sitting balance-Leahy Scale: Good Sitting balance - Comments: modI   Standing balance support: Bilateral upper extremity supported Standing balance-Leahy Scale: Good Standing balance comment: supervision with BUE support of RW                           ADL either performed or assessed with clinical judgement   ADL Overall ADL's : Needs assistance/impaired                                        General ADL Comments: Pt performing ADLs and functional mobiltiy at Supervision-Min [place]ard A level. SpO2 90s on RA. Feel pt is close to baseline. Presenting with cognitive deficits and ST memory deficits. Requiring increased cues to recall ADLs and pt repeating topics already discussed     Vision         Perception     Praxis      Pertinent Vitals/Pain Pain Assessment: No/denies pain     Hand Dominance     Extremity/Trunk Assessment Upper Extremity Assessment Upper Extremity Assessment: Overall WFL for tasks assessed   Lower Extremity Assessment Lower Extremity Assessment: Generalized weakness   Cervical / Trunk Assessment Cervical / Trunk Assessment: Normal(pt with intermittent resting tremor in head/neck and arms)   Communication Communication Communication: No difficulties   Cognition Arousal/Alertness: Awake/alert Behavior During Therapy: WFL for tasks assessed/performed Overall Cognitive Status: History of cognitive impairments - at baseline                                 General Comments: pt with short term memory deficits at baseline   General Comments  SpO2 90s on RA. HR 80-90s with activity.    Exercises     Shoulder Instructions      Home Living Family/patient  expects to be discharged to:: Assisted living Living Arrangements: Alone Available Help at Discharge: Family Type of Home: Independent living facility                       Home Equipment: Walker - 2 wheels   Additional Comments: PT discussed home informtion early today when pt's son was present for PT eval. Pt's son reporting she is close to baseline (including cognition)  Lives With: Alone(staff present if assistance needed)    Prior Functioning/Environment Level of Independence: Independent with assistive device(s)        Comments: pt is independent with use of Rollator        OT Problem List: Decreased  strength;Decreased range of motion;Decreased activity tolerance;Impaired balance (sitting and/or standing);Decreased knowledge of use of DME or AE;Decreased knowledge of precautions;Decreased cognition      OT Treatment/Interventions: Self-care/ADL training;Therapeutic exercise;Energy conservation;DME and/or AE instruction;Therapeutic activities;Patient/family education    OT Goals(Current goals can be found in the care plan section) Acute Rehab OT Goals Patient Stated Goal: "I really was hoping to go home today" OT Goal Formulation: With patient Time For Goal Achievement: 05/06/19 Potential to Achieve Goals: Good  OT Frequency: Min 2X/week   Barriers to D/C:            Co-evaluation              AM-PAC OT "6 Clicks" Daily Activity     Outcome Measure Help from another person eating meals?: None Help from another person taking care of personal grooming?: A Little Help from another person toileting, which includes using toliet, bedpan, or urinal?: A Little Help from another person bathing (including washing, rinsing, drying)?: A Little Help from another person to put on and taking off regular upper body clothing?: None Help from another person to put on and taking off regular lower body clothing?: A Little 6 Click Score: 20   End of Session Equipment Utilized During Treatment: Rolling walker Nurse Communication: Mobility status  Activity Tolerance: Patient tolerated treatment well Patient left: in bed;with call bell/phone within reach;with bed alarm set  OT Visit Diagnosis: Unsteadiness on feet (R26.81);Other abnormalities of gait and mobility (R26.89);Muscle weakness (generalized) (M62.81)                Time: [id number] OT Time Calculation (min): 18 min Charges:  OT General Charges $OT Visit: 1 Visit OT Evaluation $OT Eval Moderate Complexity: Vance, OTR/L Acute Rehab Pager: [phone] Office: [place] 04/22/2019,  5:28 PM

## 2019-04-22 NOTE — Progress Notes (Signed)
Subjective: Patient reports that she is feeling pretty good. She reports that her speech is doing better, she reports that she was having difficulty yesterday. She denied any weakness or change in sensation. She reports that her son helps put her medications in a pill box and she is in charge of remembering to take them. She thinks she may have missed one dose of medications but is not sure. Pt expressed some concern about going home after the hospital as she lives alone. Explained that additional evaluations will be done in the hospital to determine the best/safest place for her to discharge to.   Objective:  Vital signs in last 24 hours: Vitals:   04/21/19 1500 04/21/19 1515 04/21/19 1800 04/22/19 0021  BP: (!) 140/46 (!) 141/51 (!) 177/75 (!) 153/46  Pulse: (!) 56 (!) 58 60 63  Resp: (!) 21 18 19 19   Temp:   98.3 F (36.8 C) 97.9 F (36.6 C)  TempSrc:   Oral   SpO2: 98% 98% 100% 95%   Physical Exam Vitals and nursing note reviewed.  Constitutional:      General: She is not in acute distress.    Appearance: She is not ill-appearing.  Pulmonary:     Effort: Pulmonary effort is normal.  Neurological:     Mental Status: She is alert.     Cranial Nerves: No facial asymmetry.     Motor: Motor function is intact.     Comments: Pt describes sensation to light touch on the RUE as "more intense" than the LUE, which is similar to prior exam. States sensation is "more intense" on the LLE than the RLE, which is new from yesterday.  Psychiatric:        Cognition and Memory: Memory is impaired.     Comments: Pt with normal attention and able to hold conversation, though repeats herself and struggles to recall recent events/conversations    Assessment/Plan:  Active Problems:   CVA (cerebral vascular accident) [facility])  Ms. Backstrom is a 86 y.o female with atrial fibrillation on Xarelto, HTN, and MCI who presented due to slurred speech and was found to have two acute L frontal infarct on MRI.  Pt was admitted for further stroke work-up and management.  Acute CVA Pt presented after acute onset of slurred speech, which has since resolved, and decreased sensation to light touch of the RUE identified in the ED.  - permissive HTN up to pressures of 220/120 for the next 24 hours - CTA head and neck recommended, though GFR only mildly improved to 25 after rehydration - will follow-up neuro recs for alternative imaging recommendations  Neurology consulted, appreciate recommendations Likely cardioembolic, considering switching DOAC - echo pending - A1c 5.3 and lipid panel LDL 94, HDL 44, with total cholesterol 159 - continue home Xarelto 15mg  daily and atorvastatin 10mg  daily - PT/OT evaluation pending - tele - q4h neuro checks   A fib Pt taking amiodarone 200mg  and metoprolol 50mg  daily for rate and rhythm control. On anticoagulation with Xarelto. EKG on admission in sinus rhythm. - rates well controlled 60s - continue Xarelto 15mg  PO daily, unclear if pt taking this consistently at home in her pill box  - continue amiodarone 200mg  daily - holding home metoprolol 50mg  daily  HTN BP 130-170s/40-70s - continuing permissive hypertension in the setting of acute CVA - holding Metoprolol, spironolactone, and furosemide   AKI on CKD Stage III  Cr on admission bumped to 1.99 (baseline around 1.4). - pt received 1L  overnight - Cr improved to 1.80 this morning  Hypomagnesemia  1.5 on admission. Repleted with 4g IV Mag sulfate.  Diet - heart healthy Fluids - 168mL/hr LR DVT ppx - Xarelto 15mg  PO daily CODE STATUS - FULL CODE  Dispo: Anticipated discharge in approximately 0-1 day(s), pending completion of stroke work-up and PT/OT assessment.   Ladona Horns, MD 04/22/2019, 6:22 AM Pager: [phone]

## 2019-04-22 NOTE — Progress Notes (Signed)
Daily Progress note  Patient pleasant, calm, in bed. Has LR running at 100 ML/HR. Tolerated PT well today. Awaiting [place] and MRI procedure.

## 2019-04-22 NOTE — Progress Notes (Signed)
Internal Medicine Attending:   I saw and examined the patient. I reviewed Dr Adah Salvage note and I agree with the resident's findings and plan as documented in the resident's note.  Her slurred speech has completely resolved she still has some abnormal sensation to light touch.  Stroke team has been consulted.  Remaining items will be carotid artery visualization this will either be with a CT angiogram of her renal function improves or carotid Dopplers.  Her LDL is above goal of less than 70 we will need to increase her atorvastatin.  I would likely be in favor of continuing her Xarelto for reducing the risk of further cardioembolic events however will defer to stroke MD if they want to change her anticoagulation.  Otherwise agree with Dr. Ronnald Ramp documentation.

## 2019-04-22 NOTE — Progress Notes (Signed)
STROKE TEAM PROGRESS NOTE   HISTORY OF PRESENT ILLNESS (per record) Christine Conley is an 86 y.o. female  With PMH HTN, A fib ( xarelto), dementia who presented to [facility] for slurred speech.  Patient lives at [place]. Patient went to bed last night about 2130 and was normal. When she woke up this morning she noticed slurred speech. Pt. Son stated that he noticed slurred speech as well. Patient does stated she does not know who called EMS. Patient denies any vision changes, SOB, HA, weakness. Denies taking daily ASA and doesn't think she is on any blood thinning medications. Per chart she takes xarelto. ED course:  MRI: two small acute left frontal lobe cortical infarcts BP140/46  BG: 117 Modified Rankin: Rankin Score=1 NIHSS:2; slurred speech, sensory   INTERVAL HISTORY Her son is at bedsid. She is not taking her medication regularly with food and maybe misses medications. We will keep xarelto. She may skip days. Discussed with son. Will recommend keeping xarelto  But must start taking it appropriately, son and mother agree. Son feels they can manage this and stay on the Xarelto.     OBJECTIVE Vitals:   04/21/19 1515 04/21/19 1800 04/22/19 0021 04/22/19 0837  BP: (!) 141/51 (!) 177/75 (!) 153/46 (!) 151/57  Pulse: (!) 58 60 63 60  Resp: 18 19 19    Temp:  98.3 F (36.8 C) 97.9 F (36.6 C) 98 F (36.7 C)  TempSrc:  Oral  Oral  SpO2: 98% 100% 95% 96%    CBC:  Recent Labs  Lab 04/21/19 1233  WBC 4.5  NEUTROABS 2.9  HGB 11.6*  HCT 36.2  MCV 102.8*  PLT 0000000    Basic Metabolic Panel:  Recent Labs  Lab 04/21/19 1233 04/22/19 0714  NA 142 139  K 4.3 4.9  CL 105 104  CO2 28 25  GLUCOSE 117* 106*  BUN 20 20  CREATININE 1.99* 1.80*  CALCIUM 8.0* 8.3*  MG 1.5* 2.6*    Lipid Panel:     Component Value Date/Time   CHOL 159 04/22/2019 0314   TRIG 103 04/22/2019 0314   HDL 44 04/22/2019 0314   CHOLHDL 3.6 04/22/2019 0314   VLDL 21 04/22/2019 0314   LDLCALC 94  04/22/2019 0314   HgbA1c:  Lab Results  Component Value Date   HGBA1C 5.3 04/22/2019   Urine Drug Screen: No results found for: LABOPIA, COCAINSCRNUR, LABBENZ, AMPHETMU, THCU, LABBARB  Alcohol Level No results found for: [place]   MR BRAIN WO CONTRAST 04/21/2019 IMPRESSION:  Two small acute left frontal lobe cortical infarcts as described. Mild generalized parenchymal atrophy and chronic small vessel ischemic disease. Small chronic lacunar infarcts within the thalami and cerebellum. Left sphenoid sinusitis. Small bilateral mastoid effusions     Transthoracic Echocardiogram  00/00/2020 1. Left ventricular ejection fraction, by visual estimation, is 65 to 70%. The left ventricle has hyperdynamic function. There is no left ventricular hypertrophy.  2. Elevated left ventricular end-diastolic pressure.  3. Left ventricular diastolic parameters are consistent with Grade I diastolic dysfunction (impaired relaxation).  4. The left ventricle has no regional wall motion abnormalities.  5. Global right ventricle has normal systolic function.The right ventricular size is normal. No increase in right ventricular wall thickness.  6. Left atrial size was mildly dilated.  7. Right atrial size was normal.  8. Moderate calcification of the mitral valve leaflet(s).  9. Moderate thickening of the mitral valve leaflet(s). 10. Severe aortic valve annular calcification. 11. The  mitral valve is normal in structure. No evidence of mitral valve regurgitation. No evidence of mitral stenosis. 12. The tricuspid valve is normal in structure. 13. Aortic valve mean gradient measures 21.0 mmHg. 14. The aortic valve is normal in structure. Aortic valve regurgitation is not visualized. Moderate aortic valve stenosis. 15. There is severe thickening of the aortic valve. 16. The pulmonic valve was normal in structure. Pulmonic valve regurgitation is not visualized. 17. The inferior vena cava is normal in size  with greater than 50% respiratory variability, suggesting right atrial pressure of 3 mmHg.   ECG - SB rate 56 BPM. (See cardiology reading for complete details)  PHYSICAL EXAM Blood pressure (!) 151/57, pulse 60, temperature 98 F (36.7 C), temperature source Oral, resp. rate 19, SpO2 96 %.   Exam: NAD, pleasant                  Speech:    Speech is normal; fluent and spontaneous but with impaired comprehension.  Cognition:    The patient is oriented to person, place, and time; Able to follow commands.     Cranial Nerves:    The pupils are equal, round, and reactive to light.Trigeminal sensation is intact and the muscles of mastication are normal. The face is symmetric. The palate elevates in the midline. Hearing intact. Voice is normal. Shoulder shrug is normal. The tongue has normal motion without fasciculations.   Coordination:  No dysmetria or ataxia  Motor Observation:    No asymmetry, no atrophy, and no involuntary movements noted. Tone:    Normal muscle tone.     Strength:    Strength is antigravity and equal in the upper and lower limbs.      Sensation: intact to LT   ASSESSMENT/PLAN Christine Conley is a 86 y.o. female with history of HTN, A fib ( xarelto), dementia who presented to [facility] for slurred speech. She did not receive IV t-PA due to minimal deficits.  Stroke: two small acute left frontal lobe cortical infarcts - embolic - atrial fibrillation due to missed Xaralto doses and/or not taking properly  Code Stroke CT Head - not ordered  CT head - not ordered  MRI head - Two small acute left frontal lobe cortical infarcts   MRA head - not ordered. Stroke team suggests MRA of the head and carotid dopplers performed  CTA H&N - not ordered  CT Perfusion - not ordered  Carotid Doppler - Stroke team suggests MRA of the head and carotid dopplers performed  2D Echo - no thrombus or pfo however needs outpatient f/u for her severe aortic valve  thickening/calcification  [employer] Virus 2 - negative  LDL - 94  HgbA1c - 5.3  UDS - not ordered  VTE prophylaxis - Xarelto Diet  Diet Order            Diet Heart Room service appropriate? No; Fluid consistency: Thin  Diet effective now              Xarelto (rivaroxaban) daily prior to admission, now on Xarelto (rivaroxaban) daily. Continue Xarelto.   Patient counseled to be compliant with her antithrombotic medications, discussed taking it with a meal daily and not missing any doses.  Ongoing aggressive stroke risk factor management  Therapy recommendations:  pending  Disposition:  Pending  Hypertension  Home BP meds: aldactone ; Toprol ; lasix  Current BP meds: none   Stable . Permissive hypertension (OK if < 220/120) but gradually normalize  in 5-7 days  . Long-term BP goal normotensive  Hyperlipidemia  Home Lipid lowering medication: Lipitor 10 mg daily  LDL 94, goal < 70  Current lipid lowering medication: Lipitor 10 mg daily - Consider increasing Lipitor to 40 mg daily  Continue statin at discharge  Other Stroke Risk Factors  Advanced age  Former cigarette smoker - quit  Obesity, There is no height or weight on file to calculate BMI., recommend weight loss, diet and exercise as appropriate   Atrial fibrillation  Old infarcts by MRI  Non compliance with Xarelto, discussed with patient and son and they feel this can be corrected  Other Active Problems  Intolerant to ASA -> PVCs  CKD creatinine - 1.99  Hypomagnesemia - 1.5  Mild Anemia - Hb - 11.6  Hospital day # 1  Personally examined patient and images, and have participated in and made any corrections needed to history, physical, neuro exam,assessment and plan as stated above.  I have personally obtained the history, evaluated lab date, reviewed imaging studies and agree with radiology interpretations.    Sarina Ill, MD Stroke Neurology   A total of 35 minutes was spent  for the care of this patient, spent on counseling patient and family on different diagnostic and therapeutic options, counseling and coordination of care, riskd ans benefits of management, compliance, or risk factor reduction and education.  Stroke team will sign off. Will defer to team to place orders since this is on the teaching service, all recommendations above in note. Please contact us for anything further.   To contact Stroke Continuity provider, please refer to [email or web address] After hours, contact General Neurology

## 2019-04-22 NOTE — Progress Notes (Signed)
  Echocardiogram 2D Echocardiogram has been performed.  Christine Conley 04/22/2019, 12:41 PM

## 2019-04-23 LAB — URINE CULTURE: Culture: >=100000 — AB

## 2019-04-23 LAB — BASIC METABOLIC PANEL
Anion gap: 9 (ref 5–15)
BUN: 19 mg/dL (ref 8–23)
CO2: 26 mmol/L (ref 22–32)
Calcium: 8.5 mg/dL — ABNORMAL LOW (ref 8.9–10.3)
Chloride: 105 mmol/L (ref 98–111)
Creatinine, Ser: 1.89 mg/dL — ABNORMAL HIGH (ref 0.44–1.00)
GFR calc Af Amer: 27 mL/min — ABNORMAL LOW (ref >60)
GFR calc non Af Amer: 23 mL/min — ABNORMAL LOW (ref >60)
Glucose, Bld: 96 mg/dL (ref 70–99)
Potassium: 4.6 mmol/L (ref 3.5–5.1)
Sodium: 140 mmol/L (ref 135–145)

## 2019-04-23 MED ORDER — ATORVASTATIN CALCIUM 40 MG PO TABS: 40.0000 mg | ORAL_TABLET | Freq: Every day | ORAL | 2 refills | Status: DC

## 2019-04-23 NOTE — Progress Notes (Signed)
Subjective:  NAEON. Pt feeling well today. States she has been able to get up and ambulate today. Denies any weakness, syncope, SOB. Son present with her during last half of interview and states he will be able to assist with transportation. No concerns for discharge currently. Objective:  Vital signs in last 24 hours: Vitals:   04/22/19 0837 04/22/19 1617 04/22/19 2359 04/23/19 0745  BP: (!) 151/57 (!) 150/67 128/87 135/64  Pulse: 60 64 74 (!) 58  Resp:   18 19  Temp: 98 F (36.7 C) 98.5 F (36.9 C) (!) 97.5 F (36.4 C) 98.2 F (36.8 C)  TempSrc: Oral Oral Oral   SpO2: 96% 98% 97% 98%   Physical Exam  Constitutional: She is oriented to person, place, and time. No distress.  Conversant, pleasant   HENT:  Head: Normocephalic.  Eyes: Pupils are equal, round, and reactive to light. Conjunctivae and EOM are normal. Right eye exhibits no discharge. Left eye exhibits no discharge. No scleral icterus.  Cardiovascular: Normal rate, regular rhythm, S1 normal and S2 normal.  Murmur heard.  Crescendo decrescendo systolic murmur is present with a grade of 3/6. Pulmonary/Chest: No accessory muscle usage. No respiratory distress. She has no wheezes. She has no rales.  Abdominal: Soft.  Musculoskeletal:     Cervical back: Normal range of motion.  Neurological: She is alert and oriented to person, place, and time. She has normal strength. She displays normal speech. No cranial nerve deficit.  Skin: Skin is warm and dry.  Psychiatric: Affect normal.    Assessment/Plan:  Active Problems:   CVA (cerebral vascular accident) ([place])   Moderate aortic valve stenosis   Diastolic dysfunction without heart failure  Ms. Strzalka is a 86 y.o female with atrial fibrillation on Xarelto, HTN, and MCI who presented due to slurred speech and was found to have two acute L frontal infarct on MRI. Pt was admitted for further stroke work-up and management.  Acute CVA Continues to have no recurrence of  slurred speech today. Per pt she was able to ambulate yesterday with no symptoms including syncope, weakness, instability. CTA shows no acute concerns. Echo consistent with severe aortic stenosis, she will need close follow-up. Carotid doppler shows severe stenosis bilaterally however she is likely not a candidate for surgery. Plan for discharge today.  - Stable, discharge today 04/23/19 - BP have been stable - continue xarelto 15 mg daily - Increase Lipitor from 10 mg to 40 mg PO daily   Atrial fibrillation  Pt taking amiodarone 200mg  and metoprolol 50mg  daily for rate and rhythm control. On anticoagulation with Xarelto. EKG on admission in sinus rhythm. Asymptomatic today, rhythm is regular on exam - rates well controlled 60sz - continue Xarelto 15mg  PO daily, unclear if pt taking this consistently at home in her pill box  - continue amiodarone 200mg  daily - metoprolol 50mg  daily  HTN BP 130-170s/40-70s as of 04/23/19 - stable, discharge today - restart metoprolol - will allow permissive HTN over the next week given acute CVA; hold diuretics and resume at PCP follow-up   AKI on CKD Stage III  Cr on admission bumped to 1.99 (baseline around 1.4). Still elevated however appears to have stabilized, pt reports no problems with urination.  Aortic Stenosis Will need outpatient follow up  Hypomagnesemia  1.5 on admission. Repleted with 4g IV Mag sulfate.   Diet - heart healthy Fluids - 156mL/hr LR DVT ppx - Xarelto 15mg  PO daily CODE STATUS - FULL CODE  Dispo:  Today  Malva Cogan, Medical Student 04/23/2019, 10:09 AM Pager: [phone]  Attestation for Student Documentation:  I personally was present and performed or re-performed the history, physical exam and medical decision-making activities of this service and have verified that the service and findings are accurately documented in the student's note.  Modena Nunnery D, DO 04/23/2019, 3:12 PM

## 2019-04-23 NOTE — TOC Transition Note (Signed)
Transition of Care [facility]) - CM/SW Discharge Note   Patient Details  Name: Christine Conley MRN: [id number] Date of Birth: 01/27/1929  [facility] CM/SW Contact:  Carles Collet, RN Phone Number: 04/23/2019, 12:45 PM   Clinical Narrative:    Damaris Schooner w patient, discussed home health providers, and she would like to use [facility]. Referral accepted.  No other CM needs identified.     Final next level of care: [place] Barriers to Discharge: No Barriers Identified   Patient Goals and CMS Choice Patient states their goals for this hospitalization and ongoing recovery are:: to go home CMS Medicare.gov Compare Post Acute Care list provided to:: Patient Choice offered to / list presented to : Patient  Discharge Placement                       Discharge Plan and Services                          HH Arranged: PT, OT [facility] Agency: [place] ([facility]) Date [facility] Agency Contacted: 04/23/19 Time Laie: 1244 Representative spoke with at [place]: [place] ([place]) Interventions     Readmission Risk Interventions No flowsheet data found.

## 2019-04-23 NOTE — Discharge Instructions (Signed)

## 2019-04-23 NOTE — Discharge Summary (Addendum)
Name: Christine Conley MRN: [id number] DOB: Apr 05, 1929 86 y.o. PCP: [name], MD  Date of Admission: 04/21/2019 11:28 AM Date of Discharge:  04/23/19 Attending Physician: Lucious Groves, DO  Discharge Diagnosis: 1. CVA 2. Afib 3. HTN 4. AKI on CKD III 5. Moderate Aortic Stenosis 6. Bilateral Carotid stenosis   Discharge Medications: Allergies as of 04/23/2019       Reactions   Aspirin Palpitations   PVC's   Novocain [procaine] Palpitations   Penicillins Other (See Comments)   Localized mass, 'size of grapefruit' PATIENT HAS HAD A PCN REACTION WITH IMMEDIATE RASH, FACIAL/TONGUE/THROAT SWELLING, SOB, OR LIGHTHEADEDNESS WITH HYPOTENSION:  #  #  YES  #  #  Has patient had a PCN reaction causing severe rash involving mucus membranes or skin necrosis: No Has patient had a PCN reaction that required hospitalization: No Has patient had a PCN reaction occurring within the last 10 years: No   Meloxicam    UNSPECIFIED REACTION    Codeine Other (See Comments)   hallucinations        Medication List     STOP taking these medications    ciprofloxacin 500 MG tablet Commonly known as: Cipro   furosemide 20 MG tablet Commonly known as: LASIX   metroNIDAZOLE 500 MG tablet Commonly known as: Flagyl   spironolactone 25 MG tablet Commonly known as: ALDACTONE       TAKE these medications    amiodarone 200 MG tablet Commonly known as: Pacerone Take 1 tablet (200 mg total) by mouth daily.   atorvastatin 40 MG tablet Commonly known as: LIPITOR Take 1 tablet (40 mg total) by mouth daily. Start taking on: April 24, 2019 What changed:  medication strength how much to take   metoprolol succinate 50 MG 24 hr tablet Commonly known as: TOPROL-XL Take 1 tablet (50 mg total) by mouth daily.   pantoprazole 40 MG tablet Commonly known as: PROTONIX Take 40 mg by mouth daily.   potassium chloride 10 MEQ tablet Commonly known as: KLOR-CON Take 10 mEq by mouth daily.     Rivaroxaban 15 MG Tabs tablet Commonly known as: XARELTO Take 1 tablet (15 mg total) by mouth daily with supper. What changed: when to take this        Disposition and follow-up:   ChristineShaquan W Conley was discharged from [facility] in Good condition.  At the hospital follow up visit please address:  1.  CVA - felt to be cardioembolic in the setting of not fully compliant with Xarelto. Please ensure she is taking medication daily at suppertime. Home health PT/OT recommended; evaluate for continued need.   HTN - Aldactone and Lasix were held at discharge to allow for permissive HTN. Please resume at follow-up as indicated.   AKI on CKD III - only mild improvement with fluids; may be progression of CKD III; diuretics held in setting of above.   Moderate Aortic Stenosis on Echo, needs repeat echo in 1 year  Bilateral Carotid stenosis: please obtain outpatient vascular surgery referral. (severe stenosis on right, moderate on left)  2.  Labs / imaging needed at time of follow-up: none  3.  Pending labs/ test needing follow-up: none  Follow-up Appointments: Follow-up Information     [name], MD. Schedule an appointment as soon as possible for a visit in 1 week(s).   Specialty: Family Medicine Contact information: [address] [place] [address] [phone]  Hospital Course by problem list: Christine Conley is a 86 y.o female with atrial fibrillation on Xarelto, HTN, and MCI who presented due to slurred speech and was found to have two acute L frontal infarct on MRI. Pt was admitted for further stroke work-up and management.  1. Acute CVA: likely cardioembolic in the setting of afib and inconsistently taking Xarelto. Stressed importance of taking this medication every day with dinner. Work-up also revealed LDL 94, so atorvastatin was increased to 40 mg daily.  MRA did not reveal any large or medium vessel occlusion or correctable  stenosis. Her echo showed normal EF, G1DD, and severely thickened aortic valve with moderate stenosis with recommended outpatient cardiology follow-up. Additionally carotid dopplers revealed severe stenosis of the right and moderate on the left. Her neuro deficits had completely resolved by day of discharge. PT/OT eval recommended home health services which were ordered upon discharge.   2. Afib: continued on home amiodarone 200 mg and metoprolol 50 mg daily. She is prescribed Xarelto for anticoagulation, but there was a question if she had been consistently taking it. Educated patient and son on importance of taking daily. Plan to take all meds at dinner time for ease of administration. Rates remained well controlled throughout hospital stay.   3. HTN: due to acute CVA, should allow permissive HTN over the next week. She was fairly normotensive off all medications. Metoprolol was continued at discharge, but Spironolactone and Lasix were held until PCP follow-up.   4. AKI on CKD III: Cr on admission bumped to 1.99 (baseline around 1.4 1 year ago). Given that it did not respond to fluids and remained unchanged since admission, this is likely progression of CKD. Lasix and Spironolactone were held at discharge as mentioned above.   5. Moderate Aortic stenosis and severe aortic valve calcification, warrants repeat echocardiogram in 1 year.  Echo was also notable for Grade 2 DD and possible elevated left ventricular end diastolic pressure   6. Bilateral Carotid Stenosis: carotid dopplers revealed 80-99% (severe stenosis) on the right and 60-79% (moderate to severe) on the left.  Given that her CVA was on the left side (contralateral to severe stenosis) vascular surgery was not consulted.  However she would benefit from outpatient evaluation by Vascular surgery.  Discharge Vitals:   BP 135/64   Pulse (!) 58   Temp 98.2 F (36.8 C)   Resp 19   SpO2 98%   Pertinent Labs, Studies, and Procedures:   MRA No large or medium vessel occlusion or correctable proximal stenosis in the anterior circulation. Atherosclerotic irregularity in the carotid siphon regions but without stenosis. Incidental 2 mm atherosclerotic aneurysms in both carotid siphon regions.   Posterior circulation branch vessels are patent. Question of a stenosis of the non dominant left vertebral artery at the foramen magnum level. This could possibly be artifactual. Dominant right vertebral artery widely patent to the basilar.   Echo   1. Left ventricular ejection fraction, by visual estimation, is 65 to 70%. The left ventricle has hyperdynamic function. There is no left ventricular hypertrophy.  2. Elevated left ventricular end-diastolic pressure.  3. Left ventricular diastolic parameters are consistent with Grade I diastolic dysfunction (impaired relaxation).  4. The left ventricle has no regional wall motion abnormalities.  5. Global right ventricle has normal systolic function.The right ventricular size is normal. No increase in right ventricular wall thickness.  6. Left atrial size was mildly dilated.  7. Right atrial size was normal.  8. Moderate calcification of the  mitral valve leaflet(s).  9. Moderate thickening of the mitral valve leaflet(s). 10. Severe aortic valve annular calcification. 11. The mitral valve is normal in structure. No evidence of mitral valve regurgitation. No evidence of mitral stenosis. 12. The tricuspid valve is normal in structure. 13. Aortic valve mean gradient measures 21.0 mmHg. 14. The aortic valve is normal in structure. Aortic valve regurgitation is not visualized. Moderate aortic valve stenosis. 15. There is severe thickening of the aortic valve. 16. The pulmonic valve was normal in structure. Pulmonic valve regurgitation is not visualized. 17. The inferior vena cava is normal in size with greater than 50% respiratory variability, suggesting right atrial pressure of 3  mmHg.  Carotid dopplers Summary: Right Carotid: Velocities in the right ICA are consistent with a 80-99%                stenosis.   Left Carotid: Velocities in the left ICA are consistent with a 60-79% stenosis. Discharge Instructions: Discharge Instructions     Diet - low sodium heart healthy   Complete by: As directed    Discharge instructions   Complete by: As directed    Ms. Mirkin, it was a pleasure taking care of you. You were treated in the hospital for a stroke that was related to your afib. It will be important that you take Xarelto every day with dinner without missing any doses. I have placed a referral for you to get physical therapy at home. Please hold off on your spironolactone and Lasix until you follow-up with your primary care doctor within the next week. If you are not able to get into see them, please resume on 04/30/2019.  We have increased your cholesterol medication to 40 mg daily. You can take all of your medications at supper time so you do not have to worry about taking different ones throughout the day.   Take care!   Face-to-face encounter (required for Medicare/Medicaid patients)   Complete by: As directed    I Delice Bison certify that this patient is under my care and that I, or a nurse practitioner or physician's assistant working with me, had a face-to-face encounter that meets the physician face-to-face encounter requirements with this patient on 04/23/2019. The encounter with the patient was in whole, or in part for the following medical condition(s) which is the primary reason for home health care (List medical condition): CVA   The encounter with the patient was in whole, or in part, for the following medical condition, which is the primary reason for home health care: CVA   I certify that, based on my findings, the following services are medically necessary home health services: Physical therapy   Reason for Medically Necessary Home Health Services:   Therapy- Personnel officer, Public librarian Therapy- Instruction on Safe use of Assistive Devices for ADLs Therapy- Home Adaptation to Facilitate Safety     My clinical findings support the need for the above services: Unsafe ambulation due to balance issues   Further, I certify that my clinical findings support that this patient is homebound due to: Unsafe ambulation due to balance issues   Home Health   Complete by: As directed    To provide the following care/treatments:  PT OT     Increase activity slowly   Complete by: As directed    Increase activity slowly   Complete by: As directed        Signed: Delice Bison, DO 04/23/2019, 12:48 PM  Pager: [phone]

## 2019-04-24 DIAGNOSIS — I6523 Occlusion and stenosis of bilateral carotid arteries: Secondary | ICD-10-CM | POA: Diagnosis present

## 2019-07-17 ENCOUNTER — Other Ambulatory Visit: Payer: Self-pay | Admitting: Internal Medicine

## 2019-12-19 ENCOUNTER — Other Ambulatory Visit: Payer: Self-pay

## 2019-12-19 ENCOUNTER — Inpatient Hospital Stay (HOSPITAL_COMMUNITY)
Admission: EM | Admit: 2019-12-19 | Discharge: 2019-12-23 | DRG: 682 | Disposition: A | Discharge status: Skilled Nursing Facility | Payer: Medicare Other | Source: Skilled Nursing Facility | Attending: Internal Medicine | Admitting: Internal Medicine

## 2019-12-19 ENCOUNTER — Encounter (HOSPITAL_COMMUNITY): Payer: Self-pay

## 2019-12-19 DIAGNOSIS — B961 Klebsiella pneumoniae [K. pneumoniae] as the cause of diseases classified elsewhere: Secondary | ICD-10-CM | POA: Diagnosis present

## 2019-12-19 DIAGNOSIS — Z7901 Long term (current) use of anticoagulants: Secondary | ICD-10-CM

## 2019-12-19 DIAGNOSIS — Z886 Allergy status to analgesic agent status: Secondary | ICD-10-CM

## 2019-12-19 DIAGNOSIS — R197 Diarrhea, unspecified: Secondary | ICD-10-CM

## 2019-12-19 DIAGNOSIS — K529 Noninfective gastroenteritis and colitis, unspecified: Secondary | ICD-10-CM | POA: Diagnosis present

## 2019-12-19 DIAGNOSIS — Z87891 Personal history of nicotine dependence: Secondary | ICD-10-CM

## 2019-12-19 DIAGNOSIS — F419 Anxiety disorder, unspecified: Secondary | ICD-10-CM | POA: Diagnosis present

## 2019-12-19 DIAGNOSIS — E538 Deficiency of other specified B group vitamins: Secondary | ICD-10-CM

## 2019-12-19 DIAGNOSIS — G20 Parkinson's disease: Secondary | ICD-10-CM | POA: Diagnosis not present

## 2019-12-19 DIAGNOSIS — Z20822 Contact with and (suspected) exposure to covid-19: Secondary | ICD-10-CM | POA: Diagnosis present

## 2019-12-19 DIAGNOSIS — D539 Nutritional anemia, unspecified: Secondary | ICD-10-CM | POA: Diagnosis present

## 2019-12-19 DIAGNOSIS — Z66 Do not resuscitate: Secondary | ICD-10-CM | POA: Diagnosis present

## 2019-12-19 DIAGNOSIS — Z880 Allergy status to penicillin: Secondary | ICD-10-CM

## 2019-12-19 DIAGNOSIS — N189 Chronic kidney disease, unspecified: Secondary | ICD-10-CM

## 2019-12-19 DIAGNOSIS — Z8673 Personal history of transient ischemic attack (TIA), and cerebral infarction without residual deficits: Secondary | ICD-10-CM

## 2019-12-19 DIAGNOSIS — I4891 Unspecified atrial fibrillation: Secondary | ICD-10-CM | POA: Diagnosis present

## 2019-12-19 DIAGNOSIS — R410 Disorientation, unspecified: Secondary | ICD-10-CM

## 2019-12-19 DIAGNOSIS — Z87440 Personal history of urinary (tract) infections: Secondary | ICD-10-CM

## 2019-12-19 DIAGNOSIS — E860 Dehydration: Secondary | ICD-10-CM | POA: Diagnosis present

## 2019-12-19 DIAGNOSIS — G9341 Metabolic encephalopathy: Secondary | ICD-10-CM

## 2019-12-19 DIAGNOSIS — F05 Delirium due to known physiological condition: Secondary | ICD-10-CM | POA: Diagnosis present

## 2019-12-19 DIAGNOSIS — Z6832 Body mass index (BMI) 32.0-32.9, adult: Secondary | ICD-10-CM

## 2019-12-19 DIAGNOSIS — Z8249 Family history of ischemic heart disease and other diseases of the circulatory system: Secondary | ICD-10-CM

## 2019-12-19 DIAGNOSIS — E871 Hypo-osmolality and hyponatremia: Secondary | ICD-10-CM | POA: Diagnosis present

## 2019-12-19 DIAGNOSIS — N179 Acute kidney failure, unspecified: Principal | ICD-10-CM | POA: Diagnosis present

## 2019-12-19 DIAGNOSIS — Z885 Allergy status to narcotic agent status: Secondary | ICD-10-CM

## 2019-12-19 DIAGNOSIS — F0390 Unspecified dementia without behavioral disturbance: Secondary | ICD-10-CM

## 2019-12-19 DIAGNOSIS — F0281 Dementia in other diseases classified elsewhere with behavioral disturbance: Secondary | ICD-10-CM | POA: Diagnosis not present

## 2019-12-19 DIAGNOSIS — I129 Hypertensive chronic kidney disease with stage 1 through stage 4 chronic kidney disease, or unspecified chronic kidney disease: Secondary | ICD-10-CM | POA: Diagnosis present

## 2019-12-19 DIAGNOSIS — E162 Hypoglycemia, unspecified: Secondary | ICD-10-CM | POA: Diagnosis present

## 2019-12-19 DIAGNOSIS — N184 Chronic kidney disease, stage 4 (severe): Secondary | ICD-10-CM | POA: Diagnosis present

## 2019-12-19 DIAGNOSIS — E669 Obesity, unspecified: Secondary | ICD-10-CM | POA: Diagnosis present

## 2019-12-19 DIAGNOSIS — I480 Paroxysmal atrial fibrillation: Secondary | ICD-10-CM | POA: Diagnosis present

## 2019-12-19 DIAGNOSIS — Z888 Allergy status to other drugs, medicaments and biological substances status: Secondary | ICD-10-CM

## 2019-12-19 DIAGNOSIS — E785 Hyperlipidemia, unspecified: Secondary | ICD-10-CM

## 2019-12-19 DIAGNOSIS — I350 Nonrheumatic aortic (valve) stenosis: Secondary | ICD-10-CM

## 2019-12-19 DIAGNOSIS — D631 Anemia in chronic kidney disease: Secondary | ICD-10-CM

## 2019-12-19 DIAGNOSIS — I10 Essential (primary) hypertension: Secondary | ICD-10-CM | POA: Diagnosis present

## 2019-12-19 DIAGNOSIS — Z79899 Other long term (current) drug therapy: Secondary | ICD-10-CM

## 2019-12-19 DIAGNOSIS — E875 Hyperkalemia: Secondary | ICD-10-CM | POA: Diagnosis present

## 2019-12-19 DIAGNOSIS — K219 Gastro-esophageal reflux disease without esophagitis: Secondary | ICD-10-CM | POA: Diagnosis present

## 2019-12-19 DIAGNOSIS — N390 Urinary tract infection, site not specified: Secondary | ICD-10-CM | POA: Diagnosis present

## 2019-12-19 DIAGNOSIS — I639 Cerebral infarction, unspecified: Secondary | ICD-10-CM | POA: Diagnosis present

## 2019-12-19 DIAGNOSIS — R262 Difficulty in walking, not elsewhere classified: Secondary | ICD-10-CM | POA: Diagnosis present

## 2019-12-19 DIAGNOSIS — R4182 Altered mental status, unspecified: Secondary | ICD-10-CM | POA: Insufficient documentation

## 2019-12-19 LAB — URINALYSIS, ROUTINE W REFLEX MICROSCOPIC
Bilirubin Urine: NEGATIVE
Glucose, UA: NEGATIVE mg/dL
Hgb urine dipstick: NEGATIVE
Ketones, ur: NEGATIVE mg/dL
Nitrite: NEGATIVE
Protein, ur: NEGATIVE mg/dL
Specific Gravity, Urine: 1.014 (ref 1.005–1.030)
pH: 5 (ref 5.0–8.0)

## 2019-12-19 LAB — CBC
HCT: 34.2 % — ABNORMAL LOW (ref 36.0–46.0)
Hemoglobin: 10.8 g/dL — ABNORMAL LOW (ref 12.0–15.0)
MCH: 31.9 pg (ref 26.0–34.0)
MCHC: 31.6 g/dL (ref 30.0–36.0)
MCV: 100.9 fL — ABNORMAL HIGH (ref 80.0–100.0)
Platelets: 255 10*3/uL (ref 150–400)
RBC: 3.39 MIL/uL — ABNORMAL LOW (ref 3.87–5.11)
RDW: 15 % (ref 11.5–15.5)
WBC: 4.9 10*3/uL (ref 4.0–10.5)
nRBC: 0 % (ref 0.0–0.2)

## 2019-12-19 LAB — COMPREHENSIVE METABOLIC PANEL
ALT: 38 U/L (ref 0–44)
AST: 71 U/L — ABNORMAL HIGH (ref 15–41)
Albumin: 3.2 g/dL — ABNORMAL LOW (ref 3.5–5.0)
Alkaline Phosphatase: 88 U/L (ref 38–126)
Anion gap: 10 (ref 5–15)
BUN: 31 mg/dL — ABNORMAL HIGH (ref 8–23)
CO2: 23 mmol/L (ref 22–32)
Calcium: 8.1 mg/dL — ABNORMAL LOW (ref 8.9–10.3)
Chloride: 103 mmol/L (ref 98–111)
Creatinine, Ser: 3.29 mg/dL — ABNORMAL HIGH (ref 0.44–1.00)
GFR calc Af Amer: 14 mL/min — ABNORMAL LOW (ref >60)
GFR calc non Af Amer: 12 mL/min — ABNORMAL LOW (ref >60)
Glucose, Bld: 88 mg/dL (ref 70–99)
Potassium: 5 mmol/L (ref 3.5–5.1)
Sodium: 136 mmol/L (ref 135–145)
Total Bilirubin: 1 mg/dL (ref 0.3–1.2)
Total Protein: 6.4 g/dL — ABNORMAL LOW (ref 6.5–8.1)

## 2019-12-19 LAB — SARS CORONAVIRUS 2 BY RT PCR (HOSPITAL ORDER, PERFORMED IN ~~LOC~~ HOSPITAL LAB): SARS Coronavirus 2: NEGATIVE

## 2019-12-19 LAB — TROPONIN I (HIGH SENSITIVITY): Troponin I (High Sensitivity): 13 ng/L (ref <18)

## 2019-12-19 MED ORDER — SODIUM CHLORIDE 0.9 % IV SOLN: INTRAVENOUS | Status: DC

## 2019-12-19 MED ORDER — SODIUM CHLORIDE 0.9 % IV BOLUS
1000.0000 mL | Freq: Once | INTRAVENOUS | Status: AC
  Administered 2019-12-19: 1000 mL via INTRAVENOUS

## 2019-12-19 NOTE — ED Triage Notes (Signed)
Pt from [place] with ems for AMS. Per son who visited the pt this morning she is not acting herself, baseline dementia. Finished course of abx for UTI yesterday. Fever treated with tylenol at 10am this morning. Pt alert, no complaints

## 2019-12-19 NOTE — ED Notes (Signed)
IV team remains at bedside 

## 2019-12-19 NOTE — ED Provider Notes (Signed)
[facility] EMERGENCY DEPARTMENT Provider Note   CSN: [id number] Arrival date & time: 12/19/19  1111     History Chief Complaint  Patient presents with  . Altered Mental Status    Christine Conley is a 86 y.o. female with history of paroxysmal atrial fibrillation on Eliquis, CKD, dementia and short-term memory issues, stroke, carotid artery stenosis followed by vascular surgery, GERD presents [facility] the [facility] for evaluation of altered mental status.  Level 5 caveat due to patient's dementia.  She is able to tell me her name but seems confused.  Her son is at bedside who provides most of the history.  He tells me that over the last week patient has had a gradual decline in her mental status.  Has been talking "out of her head" and stating that she is seeing spots in her vision and other stuff that is not there.  Also reports increased shakiness in her extremities specifically in her hands and patient unable to hold things up.  This shakiness comes and goes. Patient has had difficulty ambulating and is now requiring 1 or 2 people to help her transfer.  Son states that 2 to 3 weeks ago she was walking to [place] with a walker with minimal assistance.  Patient has had left ankle pain and has had x-rays but these were normal, son unsure if this is contributing to her walking issues.  Son reports that in the last couple of weeks the only significant changes have been that she was diagnosed and treated for UTI and she recently had some dental extractions and finished an antibiotic on Sunday.  She is had looser diarrhea for about 1 week.  Patient tells me she is having 1 or 2 bowel movements daily.  She denies any blood.  Son states that patient's PCP has been monitoring her kidney function.  Recently she was on a fluid pill that made her kidney function high and she was taken off of this medicine but this caused significant leg swelling and patient was not able to walk so PCP put her  back on the fluid pill.  She is not taking Lasix.  No report of falls.  HPI     Past Medical History:  Diagnosis Date  . Acid reflux   . Anxiety   . Atrial fibrillation ([place])   . Bilateral ovarian cysts   . Hypertension   . Memory loss    patient has been tested for dementia but was not diagnosed    Patient Active Problem List   Diagnosis Date Noted  . Bilateral carotid artery stenosis 04/24/2019  . Moderate aortic valve stenosis 04/22/2019  . Diastolic dysfunction without heart failure 04/22/2019  . CVA (cerebral vascular accident) ([place]) 04/21/2019  . Chest tightness 08/19/2017  . Hypertension 08/19/2017  . Atrial fibrillation ([place]) 08/19/2017  . Anxiety 08/19/2017  . Acid reflux 08/19/2017  . Closed fracture of right proximal humerus 08/18/2017  . Fracture of humerus, proximal, right, closed 08/10/2017  . Memory loss 02/10/2017    Past Surgical History:  Procedure Laterality Date  . BILATERAL OPEN REDUCTION INTERNAL FIXATION (ORIF) PROXIMAL HUMERUS Right 08/18/2017   Procedure: RIGHT  OPEN REDUCTION INTERNAL FIXATION (ORIF) PROXIMAL HUMERUS;  Surgeon: Hiram Gash, MD;  Location: [facility];  Service: Orthopedics;  Laterality: Right;  . COLONOSCOPY    . ESOPHAGOGASTRODUODENOSCOPY    . EYE SURGERY     "to fix cross eyed"  . KNEE SURGERY Left  Replacement  . OVARIAN CYST SURGERY       OB History   No obstetric history on file.     Family History  Problem Relation Age of Onset  . Other Mother        died at 28 - old age  . Heart disease Father        died at 2    Social History   Tobacco Use  . Smoking status: Former Research scientist (life sciences)  . Smokeless tobacco: Never Used  Vaping Use  . Vaping Use: Never used  Substance Use Topics  . Alcohol use: No  . Drug use: No    Home Medications Prior to Admission medications   Medication Sig Start Date End Date Taking? Authorizing Provider  amiodarone (PACERONE) 200 MG tablet Take 1 tablet (200 mg total) by mouth daily.  10/27/16   Gareth Morgan, MD  atorvastatin (LIPITOR) 40 MG tablet Take 1 tablet (40 mg total) by mouth daily. 04/24/19 07/23/19  [place], Nila Nephew D, DO  metoprolol succinate (TOPROL-XL) 50 MG 24 hr tablet Take 1 tablet (50 mg total) by mouth daily. 11/13/16   Jola Schmidt, MD  pantoprazole (PROTONIX) 40 MG tablet Take 40 mg by mouth daily.  08/10/16   [provider]  potassium chloride (KLOR-CON) 10 MEQ tablet Take 10 mEq by mouth daily. 11/16/18   [provider]  Rivaroxaban (XARELTO) 15 MG TABS tablet Take 1 tablet (15 mg total) by mouth daily with supper. Patient taking differently: Take 15 mg by mouth daily.  10/27/16   Gareth Morgan, MD    Allergies    Aspirin, Novocain [procaine], Penicillins, Meloxicam, and Codeine  Review of Systems   Review of Systems  Gastrointestinal: Positive for diarrhea.  Musculoskeletal: Positive for arthralgias and gait problem.  Hematological: Bruises/bleeds easily.  Psychiatric/Behavioral: Positive for confusion.  All other systems reviewed and are negative.   Physical Exam Updated Vital Signs BP (!) 136/47   Pulse 78   Temp 98.1 F (36.7 C) (Oral)   Resp (!) 21   Ht 5\' 4"  (1.626 m)   SpO2 99%   BMI 30.04 kg/m   Physical Exam Vitals and nursing note reviewed.  Constitutional:      General: She is not in acute distress.    Appearance: She is well-developed.     Comments: NAD.  HENT:     Head: Normocephalic and atraumatic.     Right Ear: External ear normal.     Left Ear: External ear normal.     Nose: Nose normal.     Mouth/Throat:     Comments: Edentulous.  Lips dry but moist mucous membranes. Eyes:     General: No scleral icterus.    Conjunctiva/sclera: Conjunctivae normal.  Cardiovascular:     Rate and Rhythm: Normal rate and regular rhythm.     Heart sounds: Normal heart sounds.     Comments: Symmetric, 1+ pitting edema lower legs bilaterally and symmetrically.  No calf tenderness Pulmonary:     Effort:  Pulmonary effort is normal.     Breath sounds: Normal breath sounds.  Abdominal:     Palpations: Abdomen is soft.     Tenderness: There is no abdominal tenderness.  Musculoskeletal:        General: No deformity. Normal range of motion.     Cervical back: Normal range of motion and neck supple.  Skin:    General: Skin is warm and dry.     Capillary Refill: Capillary refill takes less  than 2 seconds.  Neurological:     Mental Status: She is alert. She is disoriented.     Comments:   Mental Status: Patient is awake, alert, oriented to name, place, and recognizes her son.  Cannot provide clear or coherent history. Dysarthric speech however patient is edentulous. No obvious aphasia. No signs of neglect.  Cranial Nerves: I not tested II visual fields unable to be tested due to patient's confusion. PERRL.   III, IV, VI EOMs intact without ptosis or diplopia  V sensation to light touch intact in all 3 divisions of trigeminal nerve bilaterally  VII facial movements symmetric bilaterally VIII hearing intact to voice/conversation  IX, X no uvula deviation, symmetric rise of soft palate/uvula XI 5/5 SCM and trapezius strength bilaterally  XII tongue protrusion midline, symmetric L/R movements  Motor: Strength 5/5 in upper/lower extremities.  Sensation to light touch intact in face, upper/lower extremities. No pronator drift. No leg drop.  Cerebellar:No ataxia with finger to nose.   Psychiatric:        Behavior: Behavior normal.        Thought Content: Thought content normal.        Judgment: Judgment normal.     ED Results / Procedures / Treatments   Labs (all labs ordered are listed, but only abnormal results are displayed) Labs Reviewed  COMPREHENSIVE METABOLIC PANEL - Abnormal; Notable for the following components:      Result Value   BUN 31 (*)    Creatinine, Ser 3.29 (*)    Calcium 8.1 (*)    Total Protein 6.4 (*)    Albumin 3.2 (*)    AST 71 (*)    GFR calc non Af Amer 12  (*)    GFR calc Af Amer 14 (*)    All other components within normal limits  CBC - Abnormal; Notable for the following components:   RBC 3.39 (*)    Hemoglobin 10.8 (*)    HCT 34.2 (*)    MCV 100.9 (*)    All other components within normal limits  URINE CULTURE  GASTROINTESTINAL PANEL BY PCR, STOOL (REPLACES STOOL CULTURE)  C DIFFICILE QUICK SCREEN W PCR REFLEX  SARS CORONAVIRUS 2 BY RT PCR (HOSPITAL ORDER, [facility])  URINALYSIS, ROUTINE W REFLEX MICROSCOPIC  TROPONIN I (HIGH SENSITIVITY)    EKG EKG Interpretation  Date/Time:  Tuesday December 19 2019 17:41:17 EDT Ventricular Rate:  59 PR Interval:    QRS Duration: 102 QT Interval:  471 QTC Calculation: 467 R Axis:   36 Text Interpretation: Junctional rhythm Low voltage, precordial leads Probable anteroseptal infarct, old No STEMI Confirmed by Octaviano Glow [phone]) on 12/19/2019 6:16:25 PM   Radiology No results found.  Procedures Procedures (including critical care time)  Medications Ordered in ED Medications  sodium chloride 0.9 % bolus 1,000 mL (1,000 mLs Intravenous New Bag/Given 12/19/19 1821)    ED Course  I have reviewed the triage vital signs and the nursing notes.  Pertinent labs & imaging results that were available during my care of the patient were reviewed by me and considered in my medical decision making (see chart for details).  Clinical Course as of Dec 18 1825  Tue Dec 19, 8923  [id number] 86 year old female with a history of Alzheimer's dementia presenting to the emergency department with hallucinations, generalized weakness, diarrhea.  She has recently been on 2 courses of antibiotics for mouth infection as well as urine infection.  She has approximately 1-2  loose stools per day.  She lives at an assisted living facility.  Her son is concerned for the past 3 to 4 days the patient appears much more confused and having visual hallucinations, which he says is unusual for her.  He  tells me that her doctor periodically titrates her lasix dose at home to help with lower extremity swelling, but she has persistently been on 20 mg of Lasix for the past month.  He thinks she does not drink much water at all, maybe 1 cup a day.  On my exam the patient is resting comfortably in the bed.  She has some mild dementia but otherwise has a pleasant demeanor.  She has no focal neurological deficits for me.  She does not have any focal abdominal tenderness or distention.  Labs are notable for an AKI with creatinine of 3.3, elevated BUN, likely related to some dehydration.  Her potassium is 5.  She has no leukocytosis or fever to suggest an acute infectious or or severe C. difficile.  Plan to give her some IV fluids, check infectious stool studies, obtain straight cath UA to evaluate for UTI, check troponin level and ECG to evaluate for atypical ACS.  I anticipate medical admission for AKI.   [MT]  [id number] asked RN Benjamine Mola for status update on patient's cath UA, IVF, labs.  RN states she has been with another patient    [CG]    Clinical Course User Index [CG] Kinnie Feil, PA-C [MT] Wyvonnia Dusky, MD   MDM Rules/Calculators/A&P                          86 year old female with history of dementia, short-term memory deficits, recent UTI and recent dental extractions finishing antibiotics on Sunday [facility] for evaluation of confusion, difficulty walking, shakiness.  Ddx includes dehydration from recent diarrhea/diuretic, electrolyte abnormalities, AKI.  Possibly occult infection like UTI.  No COVID or respiratory symptoms. She is fully vaccinated for COVID. She has no chest pain or abdominal pain. Doubt cardiac cause.  No neuro deficits on exam.    Patient's available medical records, triage nursing notes reviewed to assist with obtaining collateral history, MDM.  I obtained additional history from patient's son at bedside.  Exam is overall reassuring,  slightly hypertensive.  Seems disoriented to situation and year and unable to provide reliable history.  No abdominal tenderness.  Lips are dry but mucous membranes are moist.  No obvious neuro deficits.  Afebrile.  I have ordered lab work including CBC, BMP, urinalysis and urine culture.  Given her report of recent antibiotic use and diarrhea for 1 week stool studies including C. difficile PCR ordered as well.  I ordered 1 L IV fluid. Will plan to monitor closely, serial exams.    Your work-up personally visualized and interpreted.  Hemoglobin is stable.  Creatinine today is 3.29.  Patient's son has [facility] MyChart available on his cell phone and shows me that patient's last creatinine on 6/18 was 1.7.  EKG does not reveal any new ischemic changes, arrhythmias.  Discussed patient with EDP Trifan who has evaluated patient, requesting a troponin as well to evaluate for silent/atypical ACS.  1825: Delay in obtaining IV access, troponin and catheterized urine.  Orders were placed at 1618.  Suspect patient will need medical admission due to AKI for IV hydration.  Suspect this is secondary to fluid losses given recent diarrhea.  She was also restarted on  her diuretic recently.  Pending urinalysis, troponin. Final Clinical Impression(s) / ED Diagnoses Final diagnoses:  AKI (acute kidney injury) ([place])  Diarrhea of presumed infectious origin    Rx / DC Orders ED Discharge Orders    None       Kinnie Feil, PA-C 12/22/19 1524    Wyvonnia Dusky, MD 12/22/19 [phone]

## 2019-12-19 NOTE — ED Provider Notes (Signed)
Care assumed from Carmon Sails, [place], at shift change, please see their notes for full documentation of patient's complaint/HPI. Briefly, pt here with complaint of AMS including hallucinations, generalized weakness, and diarrhea. Has been on abx for both a mouth infection and UTI. Results so far show AKI with creatinine 3.29; receiving fluids. Awaiting U/A as well as stool sample to test for C diff. Plan is to admit to AKI.   Physical Exam  BP (!) 136/47   Pulse 78   Temp 98.1 F (36.7 C) (Oral)   Resp (!) 21   Ht 5\' 4"  (1.626 m)   SpO2 99%   BMI 30.04 kg/m   Physical Exam  ED Course/Procedures   Clinical Course as of Dec 19 1851  Tue Dec 19, 4223  227 86 year old female with a history of Alzheimer's dementia presenting to the emergency department with hallucinations, generalized weakness, diarrhea.  She has recently been on 2 courses of antibiotics for mouth infection as well as urine infection.  She has approximately 1-2 loose stools per day.  She lives at an assisted living facility.  Her son is concerned for the past 3 to 4 days the patient appears much more confused and having visual hallucinations, which he says is unusual for her.  He tells me that her doctor periodically titrates her lasix dose at home to help with lower extremity swelling, but she has persistently been on 20 mg of Lasix for the past month.  He thinks she does not drink much water at all, maybe 1 cup a day.  On my exam the patient is resting comfortably in the bed.  She has some mild dementia but otherwise has a pleasant demeanor.  She has no focal neurological deficits for me.  She does not have any focal abdominal tenderness or distention.  Labs are notable for an AKI with creatinine of 3.3, elevated BUN, likely related to some dehydration.  Her potassium is 5.  She has no leukocytosis or fever to suggest an acute infectious or or severe C. difficile.  Plan to give her some IV fluids, check infectious stool studies,  obtain straight cath UA to evaluate for UTI, check troponin level and ECG to evaluate for atypical ACS.  I anticipate medical admission for AKI.   [MT]  [id number] asked RN Benjamine Mola for status update on patient's cath UA, IVF, labs.  RN states she has been with another patient    [CG]    Clinical Course User Index [CG] Kinnie Feil, PA-C [MT] Wyvonnia Dusky, MD    Procedures  Results for orders placed or performed during the hospital encounter of 12/19/19  SARS Coronavirus 2 by RT PCR (hospital order, performed in [place] hospital lab) Nasopharyngeal Nasopharyngeal Swab   Specimen: Nasopharyngeal Swab  Result Value Ref Range   SARS Coronavirus 2 NEGATIVE NEGATIVE  Comprehensive metabolic panel  Result Value Ref Range   Sodium 136 135 - 145 mmol/L   Potassium 5.0 3.5 - 5.1 mmol/L   Chloride 103 98 - 111 mmol/L   CO2 23 22 - 32 mmol/L   Glucose, Bld 88 70 - 99 mg/dL   BUN 31 (H) 8 - 23 mg/dL   Creatinine, Ser 3.29 (H) 0.44 - 1.00 mg/dL   Calcium 8.1 (L) 8.9 - 10.3 mg/dL   Total Protein 6.4 (L) 6.5 - 8.1 g/dL   Albumin 3.2 (L) 3.5 - 5.0 g/dL   AST 71 (H) 15 - 41 U/L   ALT 38 0 -  44 U/L   Alkaline Phosphatase 88 38 - 126 U/L   Total Bilirubin 1.0 0.3 - 1.2 mg/dL   GFR calc non Af Amer 12 (L) >60 mL/min   GFR calc Af Amer 14 (L) >60 mL/min   Anion gap 10 5 - 15  CBC  Result Value Ref Range   WBC 4.9 4.0 - 10.5 K/uL   RBC 3.39 (L) 3.87 - 5.11 MIL/uL   Hemoglobin 10.8 (L) 12.0 - 15.0 g/dL   HCT 34.2 (L) 36 - 46 %   MCV 100.9 (H) 80.0 - 100.0 fL   MCH 31.9 26.0 - 34.0 pg   MCHC 31.6 30.0 - 36.0 g/dL   RDW 15.0 11.5 - 15.5 %   Platelets 255 150 - 400 K/uL   nRBC 0.0 0.0 - 0.2 %  Urinalysis, Routine w reflex microscopic Urine, Catheterized  Result Value Ref Range   Color, Urine YELLOW YELLOW   APPearance CLEAR CLEAR   Specific Gravity, Urine 1.014 1.005 - 1.030   pH 5.0 5.0 - 8.0   Glucose, UA NEGATIVE NEGATIVE mg/dL   Hgb urine dipstick NEGATIVE NEGATIVE    Bilirubin Urine NEGATIVE NEGATIVE   Ketones, ur NEGATIVE NEGATIVE mg/dL   Protein, ur NEGATIVE NEGATIVE mg/dL   Nitrite NEGATIVE NEGATIVE   Leukocytes,Ua SMALL (A) NEGATIVE   RBC / HPF 0-5 0 - 5 RBC/hpf   WBC, UA 0-5 0 - 5 WBC/hpf   Bacteria, UA MANY (A) NONE SEEN   Squamous Epithelial / LPF 0-5 0 - 5   Mucus PRESENT    Hyaline Casts, UA PRESENT   Troponin I (High Sensitivity)  Result Value Ref Range   Troponin I (High Sensitivity) 13 <18 ng/L    MDM  U/A without infection. Discussed case with Dr. Flossie Buffy [facility] who agrees to evaluate patient for admission.   This note was prepared using Dragon voice recognition software and may include unintentional dictation errors due to the inherent limitations of voice recognition software.      Eustaquio Maize, PA-C 12/19/19 2052    Wyvonnia Dusky, MD 12/19/19 2155

## 2019-12-19 NOTE — H&P (Signed)
History and Physical    Christine Conley JIR:[id number] DOB: [birth year] DOA: 12/19/2019  PCP: [name], MD  Patient coming from: [facility] assisted [facility]  I have personally briefly reviewed patient's old medical records in [place]  Chief Complaint: Altered mental status, hallucination  HPI: Christine Conley is a 86 y.o. female with medical history significant for dementia, atrial fibrillation on Xarelto, CVA, moderate aortic valve stenosis, CKD stage IV and hyperlipidemia who presents with concerns of altered mental status.  Patient is alert and oriented only to self.  Son provides history over the phone.  He states that she has been having issues with lower extremity edema for some time and her primary physician has been adjusting her Lasix on and off due to renal insufficiency.  Recently she was diagnosed with UTI with antibiotics being stopped about 2 days ago.  Also has been off a week of antibiotics for a dental extraction.  For the past week, patient has been staying in her room more due to ankle pain and in the past few days son has noticed that she is gotten more shaky in her hands and feet and is having trouble ambulating as she usually does with a walker.  He is also noticed diarrhea in the past few days.  The most concerning thing that brought her in is that several days ago she began to have hallucinations and talking to family members that were not in the room.  Son reports that at baseline mostly she has deficit with short-term memory and occasionally has trouble recognizing place and situation but has never hallucinated before.  ED Course: Patient was afebrile and normotensive on room air.  CBC showed no leukocytosis had stable macrocytic anemia with hemoglobin of 10.8.  Creatinine significantly elevated up to 3.29 from a prior of 1.89 about 8 months ago.  Troponin of 13.  UA shows small leukocyte negative nitrite, many bacteria and many hyaline casts.  Review of  Systems: Unable to obtain as patient is alert and oriented only to self  Past Medical History:  Diagnosis Date  . Acid reflux   . Anxiety   . Atrial fibrillation ([place])   . Bilateral ovarian cysts   . Hypertension   . Memory loss    patient has been tested for dementia but was not diagnosed    Past Surgical History:  Procedure Laterality Date  . BILATERAL OPEN REDUCTION INTERNAL FIXATION (ORIF) PROXIMAL HUMERUS Right 08/18/2017   Procedure: RIGHT  OPEN REDUCTION INTERNAL FIXATION (ORIF) PROXIMAL HUMERUS;  Surgeon: Hiram Gash, MD;  Location: [facility];  Service: Orthopedics;  Laterality: Right;  . COLONOSCOPY    . ESOPHAGOGASTRODUODENOSCOPY    . EYE SURGERY     "to fix cross eyed"  . KNEE SURGERY Left    Replacement  . OVARIAN CYST SURGERY       reports that she has quit smoking. She has never used smokeless tobacco. She reports that she does not drink alcohol and does not use drugs. Social History  Allergies  Allergen Reactions  . Aspirin Palpitations    PVC's  . Novocain [Procaine] Palpitations  . Penicillins Other (See Comments)    Localized mass, 'size of grapefruit' PATIENT HAS HAD A PCN REACTION WITH IMMEDIATE RASH, FACIAL/TONGUE/THROAT SWELLING, SOB, OR LIGHTHEADEDNESS WITH HYPOTENSION:  #  #  YES  #  #  Has patient had a PCN reaction causing severe rash involving mucus membranes or skin necrosis: No Has patient had a  PCN reaction that required hospitalization: No Has patient had a PCN reaction occurring within the last 10 years: No  . Meloxicam     UNSPECIFIED REACTION   . Codeine Other (See Comments)    hallucinations    Family History  Problem Relation Age of Onset  . Other Mother        died at 22 - old age  . Heart disease Father        died at 75     Prior to Admission medications   Medication Sig Start Date End Date Taking? Authorizing Provider  amiodarone (PACERONE) 200 MG tablet Take 1 tablet (200 mg total) by mouth daily. 10/27/16   Gareth Morgan, MD  pantoprazole (PROTONIX) 40 MG tablet Take 40 mg by mouth daily.  08/10/16   [provider]  potassium chloride (KLOR-CON) 10 MEQ tablet Take 10 mEq by mouth daily. 11/16/18   [provider]  Rivaroxaban (XARELTO) 15 MG TABS tablet Take 1 tablet (15 mg total) by mouth daily with supper. Patient taking differently: Take 15 mg by mouth daily.  10/27/16   Gareth Morgan, MD    Physical Exam: Vitals:   12/19/19 1822 12/19/19 1829 12/19/19 1949 12/19/19 2000  BP:   (!) 145/51 (!) 124/51  Pulse:  61 73 69  Resp:  19 (!) 27 (!) 27  Temp:      TempSrc:      SpO2:  100% 100% 95%  Height: 5\' 4"  (1.626 m)       Constitutional: NAD, calm, comfortable, well-appearing elderly female laying at 40 degree incline in bed Vitals:   12/19/19 1822 12/19/19 1829 12/19/19 1949 12/19/19 2000  BP:   (!) 145/51 (!) 124/51  Pulse:  61 73 69  Resp:  19 (!) 27 (!) 27  Temp:      TempSrc:      SpO2:  100% 100% 95%  Height: 5\' 4"  (1.626 m)      Eyes: PERRL, lids and conjunctivae normal ENMT: Mucous membranes are moist.  Neck: normal, supple Respiratory: clear to auscultation bilaterally, no wheezing, no crackles. Normal respiratory effort. No accessory muscle use.  Cardiovascular: Regular rate and rhythm, no murmurs / rubs / gallops.  Nonpitting edema bilateral ankles. Abdomen: no tenderness, no masses palpated.  Bowel sounds positive.  Musculoskeletal: no clubbing / cyanosis. No joint deformity upper and lower extremities. Good ROM, no contractures. Normal muscle tone.  Skin: no rashes, lesions, ulcers. No induration Neurologic: Alert and oriented only to self.  Able to answer questions appropriately at times of Alzheimer's would be talking to someone who is not in the room.  CN 2-12 grossly intact. Sensation intact.  Able to the left lower extremity on command but then could not understand doing hand grip bilaterally. Psychiatric: Altered mental status alert and oriented only to  self.    Labs on Admission: I have personally reviewed following labs and imaging studies  CBC: Recent Labs  Lab 12/19/19 1122  WBC 4.9  HGB 10.8*  HCT 34.2*  MCV 100.9*  PLT 371   Basic Metabolic Panel: Recent Labs  Lab 12/19/19 1122  NA 136  K 5.0  CL 103  CO2 23  GLUCOSE 88  BUN 31*  CREATININE 3.29*  CALCIUM 8.1*   GFR: CrCl cannot be calculated (Unknown ideal weight.). Liver Function Tests: Recent Labs  Lab 12/19/19 1122  AST 71*  ALT 38  ALKPHOS 88  BILITOT 1.0  PROT 6.4*  ALBUMIN 3.2*  No results for input(s): LIPASE, AMYLASE in the last 168 hours. No results for input(s): AMMONIA in the last 168 hours. Coagulation Profile: No results for input(s): INR, PROTIME in the last 168 hours. Cardiac Enzymes: No results for input(s): CKTOTAL, CKMB, CKMBINDEX, TROPONINI in the last 168 hours. BNP (last 3 results) No results for input(s): PROBNP in the last 8760 hours. HbA1C: No results for input(s): HGBA1C in the last 72 hours. CBG: No results for input(s): GLUCAP in the last 168 hours. Lipid Profile: No results for input(s): CHOL, HDL, LDLCALC, TRIG, CHOLHDL, LDLDIRECT in the last 72 hours. Thyroid Function Tests: No results for input(s): TSH, T4TOTAL, FREET4, T3FREE, THYROIDAB in the last 72 hours. Anemia Panel: No results for input(s): VITAMINB12, FOLATE, FERRITIN, TIBC, IRON, RETICCTPCT in the last 72 hours. Urine analysis:    Component Value Date/Time   COLORURINE YELLOW 12/19/2019 1950   APPEARANCEUR CLEAR 12/19/2019 1950   LABSPEC 1.014 12/19/2019 1950   PHURINE 5.0 12/19/2019 1950   GLUCOSEU NEGATIVE 12/19/2019 1950   HGBUR NEGATIVE 12/19/2019 1950   BILIRUBINUR NEGATIVE 12/19/2019 1950   KETONESUR NEGATIVE 12/19/2019 1950   PROTEINUR NEGATIVE 12/19/2019 1950   UROBILINOGEN 0.2 04/26/2008 0116   NITRITE NEGATIVE 12/19/2019 1950   LEUKOCYTESUR SMALL (A) 12/19/2019 1950    Radiological Exams on Admission: No results  found.    Assessment/Plan  Acute metabolic encephalopathy Suspect secondary to renal insufficiency with decrease medication clearance, dehydration ?  C. difficile-patient started to have several days of diarrhea following multiple rounds of antibiotics UA shows small leukocyte but negative nitrate many bacteria and midline casts-possibly contaminated.  Will not start antibiotics unless she develops fever and will await urine culture  Diarrhea Pending C. difficile test due to recent history of multiple antibiotics.  Although low suspicion because she does not have any leukocytosis, fever abdominal pain on exam.  Acute on CKD stage IV Creatinine of 3.29 from a prior of 1.89 Continuous IV fluid Avoid nephrotoxic agent Hold Lasix for now  Atrial fibrillation Rate controlled-continue amiodarone and metoprolol Continue Xarelto  Dementia Son states at baseline she mostly does have short-term memory deficits.  However hallucination is unusual for her Continue denies of  DVT prophylaxis: Xarelto Code Status: Full Family Communication: Plan discussed with son who is her [facility] POA over the phone.  All questions and concerns were addressed. disposition Plan: ALF with observation Consults called:  Admission status: Observation  Status is: Observation  The patient remains OBS appropriate and will d/c before 2 midnights.  Dispo: The patient is from: ALF              Anticipated d/c is to: ALF              Anticipated d/c date is: 1 day              Patient currently is not medically stable to d/c.         Orene Desanctis DO [facility]   If 7PM-7AM, please contact night-coverage [email or web address]   12/19/2019, 9:16 PM

## 2019-12-20 ENCOUNTER — Inpatient Hospital Stay (HOSPITAL_COMMUNITY): Payer: Medicare Other

## 2019-12-20 DIAGNOSIS — N184 Chronic kidney disease, stage 4 (severe): Secondary | ICD-10-CM | POA: Diagnosis present

## 2019-12-20 DIAGNOSIS — D539 Nutritional anemia, unspecified: Secondary | ICD-10-CM | POA: Diagnosis present

## 2019-12-20 DIAGNOSIS — Z6832 Body mass index (BMI) 32.0-32.9, adult: Secondary | ICD-10-CM | POA: Diagnosis not present

## 2019-12-20 DIAGNOSIS — Z20822 Contact with and (suspected) exposure to covid-19: Secondary | ICD-10-CM | POA: Diagnosis present

## 2019-12-20 DIAGNOSIS — B961 Klebsiella pneumoniae [K. pneumoniae] as the cause of diseases classified elsewhere: Secondary | ICD-10-CM | POA: Diagnosis present

## 2019-12-20 DIAGNOSIS — D631 Anemia in chronic kidney disease: Secondary | ICD-10-CM | POA: Diagnosis present

## 2019-12-20 DIAGNOSIS — Z66 Do not resuscitate: Secondary | ICD-10-CM | POA: Diagnosis present

## 2019-12-20 DIAGNOSIS — I129 Hypertensive chronic kidney disease with stage 1 through stage 4 chronic kidney disease, or unspecified chronic kidney disease: Secondary | ICD-10-CM | POA: Diagnosis present

## 2019-12-20 DIAGNOSIS — E785 Hyperlipidemia, unspecified: Secondary | ICD-10-CM | POA: Diagnosis present

## 2019-12-20 DIAGNOSIS — E162 Hypoglycemia, unspecified: Secondary | ICD-10-CM | POA: Diagnosis present

## 2019-12-20 DIAGNOSIS — N179 Acute kidney failure, unspecified: Secondary | ICD-10-CM | POA: Diagnosis present

## 2019-12-20 DIAGNOSIS — I480 Paroxysmal atrial fibrillation: Secondary | ICD-10-CM

## 2019-12-20 DIAGNOSIS — F0390 Unspecified dementia without behavioral disturbance: Secondary | ICD-10-CM | POA: Diagnosis present

## 2019-12-20 DIAGNOSIS — E669 Obesity, unspecified: Secondary | ICD-10-CM | POA: Diagnosis present

## 2019-12-20 DIAGNOSIS — E871 Hypo-osmolality and hyponatremia: Secondary | ICD-10-CM | POA: Diagnosis present

## 2019-12-20 DIAGNOSIS — E538 Deficiency of other specified B group vitamins: Secondary | ICD-10-CM | POA: Diagnosis present

## 2019-12-20 DIAGNOSIS — E875 Hyperkalemia: Secondary | ICD-10-CM | POA: Diagnosis present

## 2019-12-20 DIAGNOSIS — Z8249 Family history of ischemic heart disease and other diseases of the circulatory system: Secondary | ICD-10-CM | POA: Diagnosis not present

## 2019-12-20 DIAGNOSIS — K529 Noninfective gastroenteritis and colitis, unspecified: Secondary | ICD-10-CM | POA: Diagnosis present

## 2019-12-20 DIAGNOSIS — F05 Delirium due to known physiological condition: Secondary | ICD-10-CM | POA: Diagnosis present

## 2019-12-20 DIAGNOSIS — R410 Disorientation, unspecified: Secondary | ICD-10-CM | POA: Diagnosis not present

## 2019-12-20 DIAGNOSIS — G9341 Metabolic encephalopathy: Secondary | ICD-10-CM | POA: Diagnosis present

## 2019-12-20 DIAGNOSIS — N390 Urinary tract infection, site not specified: Secondary | ICD-10-CM | POA: Diagnosis present

## 2019-12-20 DIAGNOSIS — Z8673 Personal history of transient ischemic attack (TIA), and cerebral infarction without residual deficits: Secondary | ICD-10-CM | POA: Diagnosis not present

## 2019-12-20 DIAGNOSIS — R262 Difficulty in walking, not elsewhere classified: Secondary | ICD-10-CM | POA: Diagnosis present

## 2019-12-20 DIAGNOSIS — R197 Diarrhea, unspecified: Secondary | ICD-10-CM | POA: Diagnosis present

## 2019-12-20 LAB — CBC
HCT: 31.1 % — ABNORMAL LOW (ref 36.0–46.0)
Hemoglobin: 9.8 g/dL — ABNORMAL LOW (ref 12.0–15.0)
MCH: 31.6 pg (ref 26.0–34.0)
MCHC: 31.5 g/dL (ref 30.0–36.0)
MCV: 100.3 fL — ABNORMAL HIGH (ref 80.0–100.0)
Platelets: 205 10*3/uL (ref 150–400)
RBC: 3.1 MIL/uL — ABNORMAL LOW (ref 3.87–5.11)
RDW: 15.2 % (ref 11.5–15.5)
WBC: 4.6 10*3/uL (ref 4.0–10.5)
nRBC: 0 % (ref 0.0–0.2)

## 2019-12-20 LAB — GASTROINTESTINAL PANEL BY PCR, STOOL (REPLACES STOOL CULTURE)

## 2019-12-20 LAB — BASIC METABOLIC PANEL
Anion gap: 12 (ref 5–15)
BUN: 32 mg/dL — ABNORMAL HIGH (ref 8–23)
CO2: 18 mmol/L — ABNORMAL LOW (ref 22–32)
Calcium: 7.7 mg/dL — ABNORMAL LOW (ref 8.9–10.3)
Chloride: 106 mmol/L (ref 98–111)
Creatinine, Ser: 3.36 mg/dL — ABNORMAL HIGH (ref 0.44–1.00)
GFR calc Af Amer: 13 mL/min — ABNORMAL LOW (ref >60)
GFR calc non Af Amer: 12 mL/min — ABNORMAL LOW (ref >60)
Glucose, Bld: 61 mg/dL — ABNORMAL LOW (ref 70–99)
Potassium: 5.1 mmol/L (ref 3.5–5.1)
Sodium: 136 mmol/L (ref 135–145)

## 2019-12-20 LAB — C DIFFICILE QUICK SCREEN W PCR REFLEX
C Diff antigen: NEGATIVE
C Diff interpretation: NOT DETECTED
C Diff toxin: NEGATIVE

## 2019-12-20 LAB — GLUCOSE, CAPILLARY
Glucose-Capillary: 128 mg/dL — ABNORMAL HIGH (ref 70–99)
Glucose-Capillary: 58 mg/dL — ABNORMAL LOW (ref 70–99)
Glucose-Capillary: 80 mg/dL (ref 70–99)

## 2019-12-20 LAB — MRSA PCR SCREENING: MRSA by PCR: NEGATIVE

## 2019-12-20 MED ORDER — DONEPEZIL HCL 5 MG PO TABS
5.0000 mg | ORAL_TABLET | Freq: Every day | ORAL | Status: DC
  Administered 2019-12-20 – 2019-12-22 (×4): 5 mg via ORAL
  Filled 2019-12-20 (×4): qty 1

## 2019-12-20 MED ORDER — SODIUM CHLORIDE 0.45 % IV BOLUS
250.0000 mL | Freq: Once | INTRAVENOUS | Status: AC
  Administered 2019-12-20: 250 mL via INTRAVENOUS

## 2019-12-20 MED ORDER — RIVAROXABAN 15 MG PO TABS
15.0000 mg | ORAL_TABLET | Freq: Every day | ORAL | Status: DC
  Administered 2019-12-20 – 2019-12-22 (×3): 15 mg via ORAL
  Filled 2019-12-20 (×3): qty 1

## 2019-12-20 MED ORDER — HALOPERIDOL LACTATE 5 MG/ML IJ SOLN
2.0000 mg | Freq: Once | INTRAMUSCULAR | Status: AC
  Administered 2019-12-20: 2 mg via INTRAVENOUS
  Filled 2019-12-20: qty 1

## 2019-12-20 MED ORDER — PANTOPRAZOLE SODIUM 40 MG PO TBEC
40.0000 mg | DELAYED_RELEASE_TABLET | Freq: Every day | ORAL | Status: DC
  Administered 2019-12-20 – 2019-12-23 (×4): 40 mg via ORAL
  Filled 2019-12-20 (×4): qty 1

## 2019-12-20 MED ORDER — ATORVASTATIN CALCIUM 10 MG PO TABS
10.0000 mg | ORAL_TABLET | Freq: Every day | ORAL | Status: DC
  Administered 2019-12-20 – 2019-12-23 (×4): 10 mg via ORAL
  Filled 2019-12-20 (×4): qty 1

## 2019-12-20 MED ORDER — DEXTROSE 50 % IV SOLN
12.5000 g | INTRAVENOUS | Status: AC
  Administered 2019-12-20: 12.5 g via INTRAVENOUS
  Filled 2019-12-20: qty 50

## 2019-12-20 MED ORDER — AMIODARONE HCL 200 MG PO TABS
200.0000 mg | ORAL_TABLET | Freq: Every day | ORAL | Status: DC
  Administered 2019-12-20 – 2019-12-23 (×4): 200 mg via ORAL
  Filled 2019-12-20 (×4): qty 1

## 2019-12-20 MED ORDER — METOPROLOL SUCCINATE ER 25 MG PO TB24
25.0000 mg | ORAL_TABLET | Freq: Every day | ORAL | Status: DC
  Administered 2019-12-20 – 2019-12-23 (×4): 25 mg via ORAL
  Filled 2019-12-20 (×4): qty 1

## 2019-12-20 MED ORDER — SODIUM CHLORIDE 0.45 % IV SOLN: INTRAVENOUS | Status: DC

## 2019-12-20 MED ORDER — DEXTROSE-NACL 5-0.45 % IV SOLN: INTRAVENOUS | Status: AC

## 2019-12-20 NOTE — Social Work (Signed)
CSW spoke with [place] at [facility] [phone]), pt is resident there. If pt able to return with [facility] services their preferred provider it [facility]. They were provided with this writers contact information.  Pt would need fl2 w/ meds, dc summary and HH orders faxed to [phone]. CSW continuing to follow for support with disposition when medically appropriate. Await therapy evals.  Westley Hummer, MSW, [place] Work

## 2019-12-20 NOTE — Progress Notes (Signed)
Patient constantly screaming and trying to get out of bed.  Text page on call provider.

## 2019-12-20 NOTE — Plan of Care (Signed)
°  Problem: Education: Goal: Knowledge of General Education information will improve Description: Including pain rating scale, medication(s)/side effects and non-pharmacologic comfort measures Outcome: Not Progressing   Problem: Clinical Measurements: Goal: Will remain free from infection Outcome: Progressing Goal: Cardiovascular complication will be avoided Outcome: Progressing   Problem: Clinical Measurements: Goal: Cardiovascular complication will be avoided Outcome: Progressing

## 2019-12-20 NOTE — Progress Notes (Addendum)
CRITICAL VALUE ALERT  Critical Value:  Blood glucose=61   Date & Time Notied:  12/20/2019; 9144  Provider Notified: M. Sharlet Salina  Orders Received/Actions taken: Any further order?  Initiated hypoglycemic protocol for NPO patient. Administered 12.5 D50 Injection at 0509.  Will recheck CBG in 15 minutes.  Patient is asymptomatic.   Recheck at 0530, CBG=128.

## 2019-12-20 NOTE — Progress Notes (Addendum)
[facility] PROGRESS NOTE   Christine Conley:[id number] DOB: Nov 14, 1928 DOA: 12/19/2019  PCP: [name], MD  Brief History/Interval Summary: 86 y.o. female with medical history significant for dementia, atrial fibrillation on Xarelto, CVA, moderate aortic valve stenosis, CKD stage IV and hyperlipidemia who presented with concerns of altered mental status.  Patient is alert and oriented only to self.  Son provides history over the phone.  He states that she has been having issues with lower extremity edema for some time and her primary physician has been adjusting her Lasix on and off due to renal insufficiency.  Recently she was diagnosed with UTI with antibiotics being stopped about 2 days ago.  Also has been off a week of antibiotics for a dental extraction.  For the past week, patient has been staying in her room more due to ankle pain and in the past few days son has noticed that she is gotten more shaky in her hands and feet and is having trouble ambulating as she usually does with a walker.  He is also noticed diarrhea in the past few days.  The most concerning thing that brought her in is that several days ago she began to have hallucinations and talking to family members that were not in the room.  Son reports that at baseline mostly she has deficit with short-term memory and occasionally has trouble recognizing place and situation but has never hallucinated before.  ED Course: Patient was afebrile and normotensive on room air.  CBC showed no leukocytosis had stable macrocytic anemia with hemoglobin of 10.8.  Creatinine significantly elevated up to 3.29 from a prior of 1.89 about 8 months ago.  Troponin of 13.  UA shows small leukocyte negative nitrite, many bacteria and many hyaline casts.  Reason for Visit: Acute metabolic encephalopathy  Consultants: None  Procedures: None  Antibiotics: Anti-infectives (From admission, onward)   None      Subjective/Interval  History: Patient noted to be confused.  Son is at the bedside.  She does have a history of dementia but this behavior is completely new for her.  Patient unable to provide any history.  Does not answer questions appropriately.     Assessment/Plan:  Acute metabolic encephalopathy Thought to be secondary to dehydration, medications.  No obvious focal neurological deficits on exam.  Son is very concerned.  However we will go ahead with CT scan of the head as the patient has not shown much improvement in the last 24 hours.  Speech therapy eval for swallow function.  Acute diarrhea C. difficile is negative.  GI pathogen panel is pending.  Acute on chronic kidney disease stage IV Baseline creatinine around 1.8.  Presented with creatinine of 3.29.  Noted to be slightly higher today.  Continue IV fluids.  Monitor urine output.  Recheck labs tomorrow.  Avoid nephrotoxic agent.  Lasix is on hold.  Abnormal UA Apparently recently treated for UTI.  Also recently treated for a dental infection.  Will hold off on antibiotics for now as there is no clear evidence that she has clinically significant urinary tract infection.  Macrocytic anemia No evidence of overt bleeding.  Check anemia panel in the morning.  Hypoglycemia Due to poor oral intake.  Monitor periodically.  History of paroxysmal atrial fibrillation Stable.  On amiodarone and metoprolol.  Continue Xarelto.  Dementia Stable except as mentioned above.  Obesity Estimated body mass index is 30.04 kg/m as calculated from the following:   Height as of this  encounter: 5\' 4"  (1.626 m).   Weight as of 08/18/17: 79.4 kg.   DVT Prophylaxis: On rivaroxaban Code Status: DNR Family Communication: Discussed with her son Disposition Plan: Patient is here from an assisted living facility.  Anticipate return back to the same setting as long as she improves.    Medications:  Scheduled: . amiodarone  200 mg Oral Daily  . atorvastatin  10 mg  Oral Daily  . donepezil  5 mg Oral QHS  . metoprolol succinate  25 mg Oral Daily  . pantoprazole  40 mg Oral Daily  . Rivaroxaban  15 mg Oral QAC supper   Continuous:  PRN:   Objective:  Vital Signs  Vitals:   12/20/19 0023 12/20/19 0053 12/20/19 0446 12/20/19 1000  BP: (!) 125/94 124/68 (!) 109/37 (!) 150/95  Pulse: 63 65 66 73  Resp: (!) 24 20 20 18   Temp: 98.2 F (36.8 C) 98.1 F (36.7 C) 98.4 F (36.9 C) 98.1 F (36.7 C)  TempSrc: Oral Oral Oral Oral  SpO2: 98% 99% 100% 97%  Height:        Intake/Output Summary (Last 24 hours) at 12/20/2019 1308 Last data filed at 12/20/2019 1200 Gross per 24 hour  Intake 1400.77 ml  Output 200 ml  Net 1200.77 ml   There were no vitals filed for this visit.  General appearance: He is awake.  Slightly delirious.  Distracted. Resp: Clear to auscultation bilaterally.  Normal effort Cardio: S1-S2 is normal regular.  No S3-S4.  No rubs murmurs or bruit GI: Abdomen is soft.  Nontender nondistended.  Bowel sounds are present normal.  No masses organomegaly Extremities: No edema.  Moving all extremities. Neurologic: Alert.  Oriented to person.  No facial asymmetry.  Tongue is midline.  Motor strength equal bilateral upper extremities.  Mentions that she has problem with her right lower extremity which is noted to be somewhat weaker compared to the left.   Lab Results:  Data Reviewed: I have personally reviewed following labs and imaging studies  CBC: Recent Labs  Lab 12/19/19 1122 12/20/19 0335  WBC 4.9 4.6  HGB 10.8* 9.8*  HCT 34.2* 31.1*  MCV 100.9* 100.3*  PLT 255 580    Basic Metabolic Panel: Recent Labs  Lab 12/19/19 1122 12/20/19 0335  NA 136 136  K 5.0 5.1  CL 103 106  CO2 23 18*  GLUCOSE 88 61*  BUN 31* 32*  CREATININE 3.29* 3.36*  CALCIUM 8.1* 7.7*    GFR: CrCl cannot be calculated (Unknown ideal weight.).  Liver Function Tests: Recent Labs  Lab 12/19/19 1122  AST 71*  ALT 38  ALKPHOS 88    BILITOT 1.0  PROT 6.4*  ALBUMIN 3.2*     CBG: Recent Labs  Lab 12/20/19 0530  GLUCAP 128*     Recent Results (from the past 240 hour(s))  SARS Coronavirus 2 by RT PCR (hospital order, performed in [facility] hospital lab) Nasopharyngeal Nasopharyngeal Swab     Status: None   Collection Time: 12/19/19  4:18 PM   Specimen: Nasopharyngeal Swab  Result Value Ref Range Status   SARS Coronavirus 2 NEGATIVE NEGATIVE Final    Comment: (NOTE) SARS-CoV-2 target nucleic acids are NOT DETECTED.  The SARS-CoV-2 RNA is generally detectable in upper and lower respiratory specimens during the acute phase of infection. The lowest concentration of SARS-CoV-2 viral copies this assay can detect is 250 copies / mL. A negative result does not preclude SARS-CoV-2 infection and should not be used  as the sole basis for treatment or other patient management decisions.  A negative result may occur with improper specimen collection / handling, submission of specimen other than nasopharyngeal swab, presence of viral mutation(s) within the areas targeted by this assay, and inadequate number of viral copies (<250 copies / mL). A negative result must be combined with clinical observations, patient history, and epidemiological information.  Fact Sheet for Patients:   StrictlyIdeas.no  Fact Sheet for Healthcare Providers: BankingDealers.co.za  This test is not yet approved or  cleared by the [place] FDA and has been authorized for detection and/or diagnosis of SARS-CoV-2 by FDA under an Emergency Use Authorization (EUA).  This EUA will remain in effect (meaning this test can be used) for the duration of the COVID-19 declaration under Section 564(b)(1) of the Act, 21 U.S.C. section 360bbb-3(b)(1), unless the authorization is terminated or revoked sooner.  Performed at [facility], [place] [address] [place], [place] [address]   MRSA PCR  Screening     Status: None   Collection Time: 12/20/19 12:52 AM   Specimen: Nasal Mucosa; Nasopharyngeal  Result Value Ref Range Status   MRSA by PCR NEGATIVE NEGATIVE Final    Comment:        The GeneXpert MRSA Assay (FDA approved for NASAL specimens only), is one component of a comprehensive MRSA colonization surveillance program. It is not intended to diagnose MRSA infection nor to guide or monitor treatment for MRSA infections. Performed at [facility], [place] [address], [place], [place] [address]   C Difficile Quick Screen w PCR reflex     Status: None   Collection Time: 12/20/19  1:08 AM   Specimen: Stool  Result Value Ref Range Status   C Diff antigen NEGATIVE NEGATIVE Final   C Diff toxin NEGATIVE NEGATIVE Final   C Diff interpretation No C. difficile detected.  Final    Comment: Performed at [facility], [place] [address] [place], [place] [address]      Radiology Studies: No results found.     LOS: 0 days   Sharlize Hoar [employer] on [email or web address]  12/20/2019, 1:08 PM

## 2019-12-20 NOTE — Evaluation (Signed)
Clinical/Bedside Swallow Evaluation Patient Details  Name: Christine Conley MRN: [id number] Date of Birth: Jun 20, 1928  Today's Date: 12/20/2019 Time: SLP Start Time (ACUTE ONLY): 1455 SLP Stop Time (ACUTE ONLY): 1515 SLP Time Calculation (min) (ACUTE ONLY): 20 min  Past Medical History:  Past Medical History:  Diagnosis Date  . Acid reflux   . Anxiety   . Atrial fibrillation ([place])   . Bilateral ovarian cysts   . Hypertension   . Memory loss    patient has been tested for dementia but was not diagnosed   Past Surgical History:  Past Surgical History:  Procedure Laterality Date  . BILATERAL OPEN REDUCTION INTERNAL FIXATION (ORIF) PROXIMAL HUMERUS Right 08/18/2017   Procedure: RIGHT  OPEN REDUCTION INTERNAL FIXATION (ORIF) PROXIMAL HUMERUS;  Surgeon: Hiram Gash, MD;  Location: [facility];  Service: Orthopedics;  Laterality: Right;  . COLONOSCOPY    . ESOPHAGOGASTRODUODENOSCOPY    . EYE SURGERY     "to fix cross eyed"  . KNEE SURGERY Left    Replacement  . OVARIAN CYST SURGERY     HPI:  86yo female admitted 12/19/19 from ALF with AMS, including hallucinations. PMH: dementia, AFib, CVA, moderate aortic stenosis, CKD4, HLD, GERD, anxiety. MRI = 2 small acute left frontal lobe cortical infarcts, mild generalized parenchymal atrophy and chronic small vessel ischemic disease, small chronic lacunar infarcts within the thalami and cerebellum.   Assessment / Plan / Recommendation Clinical Impression  Pt seen at bedside for assessment of swallow function and safety. Pt was awake, resting in bed. She is edentulous, and unable to consistently follow commands. She is quite confused, but able to participate in evaluation. Pt tolerated trials of thin liquid, puree, and solid textures. Extended oral prep and propulsion with oral residue noted following solid texture. Pt was noted to talk almost continuously while chewing graham cracker, increasing aspiration risk significantly. At this time, recommend puree  (Dys1) diet with thin liquids, crushed meds. SLP will follow for diet tolerance and education, as well as to determine readiness to advance textures. Safe swallow precautions posted at [facility] and RN/MD informed.   SLP Visit Diagnosis: Dysphagia, unspecified (R13.10)    Aspiration Risk  Mild aspiration risk;Risk for inadequate nutrition/hydration    Diet Recommendation Dysphagia 1 (Puree);Thin liquid   Liquid Administration via: Straw Medication Administration: Crushed with puree Supervision: Staff to assist with self feeding;Full supervision/cueing for compensatory strategies Compensations: Minimize environmental distractions;Slow rate;Small sips/bites Postural Changes: Seated upright at 90 degrees;Remain upright for at least 30 minutes after po intake    Other  Recommendations Oral Care Recommendations: Oral care BID   Follow up Recommendations 24 hour supervision/assistance;Skilled Nursing facility      Frequency and Duration min 1 x/week  1 week;2 weeks       Prognosis Prognosis for Safe Diet Advancement: Fair Barriers to Reach Goals: Cognitive deficits      Swallow Study   General Date of Onset: 12/19/19 HPI: 86yo female admitted 12/19/19 from ALF with AMS, including hallucinations. PMH: dementia, AFib, CVA, moderate aortic stenosis, CKD4, HLD, GERD, anxiety. MRI = 2 small acute left frontal lobe cortical infarcts, mild generalized parenchymal atrophy and chronic small vessel ischemic disease, small chronic lacunar infarcts within the thalami and cerebellum. Type of Study: Bedside Swallow Evaluation Previous Swallow Assessment: none Diet Prior to this Study: NPO Temperature Spikes Noted: No Respiratory Status: Room air History of Recent Intubation: No Behavior/Cognition: Alert;Cooperative;Pleasant mood;Confused;Requires cueing Oral Cavity Assessment: Within Functional Limits Oral Care Completed by SLP: No  Oral Cavity - Dentition: Edentulous Vision: Functional for  self-feeding Self-Feeding Abilities: Total assist Patient Positioning: Upright in bed Baseline Vocal Quality: Normal Volitional Cough: Cognitively unable to elicit Volitional Swallow: Unable to elicit    Oral/Motor/Sensory Function Overall Oral Motor/Sensory Function: Within functional limits   Thin Liquid Thin Liquid: Within functional limits Presentation: Straw    Puree Puree: Within functional limits Presentation: Spoon   Solid     Solid: Impaired Oral Phase Impairments: Impaired mastication;Poor awareness of bolus Oral Phase Functional Implications: Prolonged oral transit;Impaired mastication;Oral residue     Christine Conley B. Quentin Ore, [facility], [place] Speech Language Pathologist Office: [phone]  Shonna Chock 12/20/2019,3:24 PM

## 2019-12-20 NOTE — Progress Notes (Signed)
Received patient from ED, AO to self only, VSS, denies pain, stool sample obtained for CDiff PCR and GI Panel, MRSA PCR done per protocol, place on low bed with floor mats. Text paged [facility] for info. Patient now resting on bed trying to get sleep.  Will monitor.

## 2019-12-21 DIAGNOSIS — G9341 Metabolic encephalopathy: Secondary | ICD-10-CM

## 2019-12-21 DIAGNOSIS — K529 Noninfective gastroenteritis and colitis, unspecified: Secondary | ICD-10-CM

## 2019-12-21 LAB — URINE CULTURE: Culture: >=100000 — AB

## 2019-12-21 LAB — RETICULOCYTES
Immature Retic Fract: 18.1 % — ABNORMAL HIGH (ref 2.3–15.9)
RBC.: 3.16 MIL/uL — ABNORMAL LOW (ref 3.87–5.11)
Retic Count, Absolute: 49 10*3/uL (ref 19.0–186.0)
Retic Ct Pct: 1.6 % (ref 0.4–3.1)

## 2019-12-21 LAB — BASIC METABOLIC PANEL
Anion gap: 10 (ref 5–15)
BUN: 29 mg/dL — ABNORMAL HIGH (ref 8–23)
CO2: 20 mmol/L — ABNORMAL LOW (ref 22–32)
Calcium: 8.1 mg/dL — ABNORMAL LOW (ref 8.9–10.3)
Chloride: 108 mmol/L (ref 98–111)
Creatinine, Ser: 2.47 mg/dL — ABNORMAL HIGH (ref 0.44–1.00)
GFR calc Af Amer: 19 mL/min — ABNORMAL LOW (ref >60)
GFR calc non Af Amer: 17 mL/min — ABNORMAL LOW (ref >60)
Glucose, Bld: 91 mg/dL (ref 70–99)
Potassium: 4.4 mmol/L (ref 3.5–5.1)
Sodium: 138 mmol/L (ref 135–145)

## 2019-12-21 LAB — CBC
HCT: 32.1 % — ABNORMAL LOW (ref 36.0–46.0)
Hemoglobin: 10.4 g/dL — ABNORMAL LOW (ref 12.0–15.0)
MCH: 32.9 pg (ref 26.0–34.0)
MCHC: 32.4 g/dL (ref 30.0–36.0)
MCV: 101.6 fL — ABNORMAL HIGH (ref 80.0–100.0)
Platelets: 211 10*3/uL (ref 150–400)
RBC: 3.16 MIL/uL — ABNORMAL LOW (ref 3.87–5.11)
RDW: 15.2 % (ref 11.5–15.5)
WBC: 4.4 10*3/uL (ref 4.0–10.5)
nRBC: 0 % (ref 0.0–0.2)

## 2019-12-21 LAB — FERRITIN: Ferritin: 230 ng/mL (ref 11–307)

## 2019-12-21 LAB — GLUCOSE, CAPILLARY
Glucose-Capillary: 83 mg/dL (ref 70–99)
Glucose-Capillary: 84 mg/dL (ref 70–99)
Glucose-Capillary: 93 mg/dL (ref 70–99)
Glucose-Capillary: 183 mg/dL — ABNORMAL HIGH (ref 70–99)
Glucose-Capillary: 126 mg/dL — ABNORMAL HIGH (ref 70–99)
Glucose-Capillary: 133 mg/dL — ABNORMAL HIGH (ref 70–99)

## 2019-12-21 LAB — IRON AND TIBC
Iron: 40 ug/dL (ref 28–170)
Saturation Ratios: 28 % (ref 10.4–31.8)
TIBC: 141 ug/dL — ABNORMAL LOW (ref 250–450)
UIBC: 101 ug/dL

## 2019-12-21 LAB — VITAMIN B12: Vitamin B-12: 421 pg/mL (ref 180–914)

## 2019-12-21 LAB — FOLATE: Folate: 2 ng/mL — ABNORMAL LOW (ref >5.9)

## 2019-12-21 MED ORDER — HALOPERIDOL LACTATE 5 MG/ML IJ SOLN: 0.5000 mg | Freq: Four times a day (QID) | INTRAMUSCULAR | Status: DC | PRN

## 2019-12-21 MED ORDER — FOLIC ACID 1 MG PO TABS
1.0000 mg | ORAL_TABLET | Freq: Every day | ORAL | Status: DC
  Administered 2019-12-21 – 2019-12-23 (×3): 1 mg via ORAL
  Filled 2019-12-21 (×3): qty 1

## 2019-12-21 NOTE — Progress Notes (Signed)
  Hypoglycemic Event  CBG: 58  Treatment: 4 oz juice/soda  Symptoms: None  Follow-up CBG: Time:2121 CBG Result:80  Possible Reasons for Event: Unknown  Comments/MD notified:none    Vincenzo Stave A Giovana Faciane

## 2019-12-21 NOTE — Discharge Instructions (Signed)

## 2019-12-21 NOTE — NC FL2 (Signed)
[place] LEVEL OF CARE SCREENING TOOL     IDENTIFICATION  Patient Name: Christine Conley Birthdate: 1928/11/19 Sex: female Admission Date (Current Location): 12/19/2019  [facility] and [place] Number:  Herbalist and Address:  [facility], [place] [address], [place],  [address]      Provider Number: [id number]  Attending Physician Name and Address:  Bonnielee Haff, MD  Relative Name and Phone Number:       Current Level of Care: Hospital Recommended Level of Care: Welcome Prior Approval Number:    Date Approved/Denied:   PASRR Number: [id number]  Discharge Plan: SNF    Current Diagnoses: Patient Active Problem List   Diagnosis Date Noted  . Acute-on-chronic kidney injury ([place]) 12/20/2019  . ARF (acute renal failure) ([place]) 12/20/2019  . AMS (altered mental status) 12/19/2019  . Bilateral carotid artery stenosis 04/24/2019  . Moderate aortic valve stenosis 04/22/2019  . Diastolic dysfunction without heart failure 04/22/2019  . CVA (cerebral vascular accident) ([place]) 04/21/2019  . Chest tightness 08/19/2017  . Hypertension 08/19/2017  . Atrial fibrillation ([place]) 08/19/2017  . Anxiety 08/19/2017  . Acid reflux 08/19/2017  . Closed fracture of right proximal humerus 08/18/2017  . Fracture of humerus, proximal, right, closed 08/10/2017  . Memory loss 02/10/2017    Orientation RESPIRATION BLADDER Height & Weight     Self, Place  Normal Incontinent Weight: 190 lb (86.2 kg) Height:  5\' 4"  (162.6 cm)  BEHAVIORAL SYMPTOMS/MOOD NEUROLOGICAL BOWEL NUTRITION STATUS      Continent Diet (Dysphagia 1 (Puree);Thin liquid; Liquid Administration via: Straw; Medication Administration: Crushed with puree)  AMBULATORY STATUS COMMUNICATION OF NEEDS Skin   Limited Assist Verbally Other (Comment) (generalized ecchymosis)                       Personal Care Assistance Level of Assistance  Bathing, Feeding,  Dressing Bathing Assistance: Limited assistance Feeding assistance: Independent Dressing Assistance: Limited assistance     Functional Limitations Info  Sight, Hearing, Speech Sight Info: Adequate Hearing Info: Adequate Speech Info: Adequate    SPECIAL CARE FACTORS FREQUENCY  PT (By licensed PT), OT (By licensed OT)     PT Frequency: 3x week OT Frequency: 2x week            Contractures Contractures Info: Not present    Additional Factors Info  Code Status, Psychotropic, Allergies Code Status Info: DNR Allergies Info: Aspirin, Novocain (Procaine), Penicillins, Meloxicam, Codeine Psychotropic Info: donepezil (ARICEPT) tablet 5 mg daily at bedtime PO         Current Medications (12/21/2019):  This is the current hospital active medication list Current Facility-Administered Medications  Medication Dose Route Frequency Provider Last Rate Last Admin  . amiodarone (PACERONE) tablet 200 mg  200 mg Oral Daily Tu, Ching T, DO   200 mg at 12/21/19 1052  . atorvastatin (LIPITOR) tablet 10 mg  10 mg Oral Daily Tu, Ching T, DO   10 mg at 12/21/19 1052  . dextrose 5 %-0.45 % sodium chloride infusion   Intravenous Continuous Bonnielee Haff, MD 75 mL/hr at 12/20/19 1722 New Bag at 12/20/19 1722  . donepezil (ARICEPT) tablet 5 mg  5 mg Oral QHS Tu, Ching T, DO   5 mg at 12/20/19 2141  . folic acid (FOLVITE) tablet 1 mg  1 mg Oral Daily Bonnielee Haff, MD   1 mg at 12/21/19 1052  . haloperidol lactate (HALDOL) injection 0.5 mg  0.5 mg Intravenous Q6H PRN Bonnielee Haff, MD      . metoprolol succinate (TOPROL-XL) 24 hr tablet 25 mg  25 mg Oral Daily Tu, Ching T, DO   25 mg at 12/21/19 1052  . pantoprazole (PROTONIX) EC tablet 40 mg  40 mg Oral Daily Tu, Ching T, DO   40 mg at 12/21/19 1052  . Rivaroxaban (XARELTO) tablet 15 mg  15 mg Oral QAC supper Tu, Ching T, DO   15 mg at 12/20/19 1826     Discharge Medications: Please see discharge summary for a list of discharge  medications.  Relevant Imaging Results:  Relevant Lab Results:   Additional Information [id number] [place], [place]

## 2019-12-21 NOTE — Progress Notes (Addendum)
[facility] PROGRESS NOTE   Christine Conley EYC:[id number] DOB: Apr 30, 1928 DOA: 12/19/2019  PCP: [name], MD  Brief History/Interval Summary: 86 y.o. female with medical history significant for dementia, atrial fibrillation on Xarelto, CVA, moderate aortic valve stenosis, CKD stage IV and hyperlipidemia who presented with concerns of altered mental status.  Patient is alert and oriented only to self.  Son provides history over the phone.  He states that she has been having issues with lower extremity edema for some time and her primary physician has been adjusting her Lasix on and off due to renal insufficiency.  Recently she was diagnosed with UTI with antibiotics being stopped about 2 days ago.  Also has been off a week of antibiotics for a dental extraction.  For the past week, patient has been staying in her room more due to ankle pain and in the past few days son has noticed that she is gotten more shaky in her hands and feet and is having trouble ambulating as she usually does with a walker.  He is also noticed diarrhea in the past few days.  The most concerning thing that brought her in is that several days ago she began to have hallucinations and talking to family members that were not in the room.  Son reports that at baseline mostly she has deficit with short-term memory and occasionally has trouble recognizing place and situation but has never hallucinated before.  ED Course: Patient was afebrile and normotensive on room air.  CBC showed no leukocytosis had stable macrocytic anemia with hemoglobin of 10.8.  Creatinine significantly elevated up to 3.29 from a prior of 1.89 about 8 months ago.  Troponin of 13.  UA shows small leukocyte negative nitrite, many bacteria and many hyaline casts.  Reason for Visit: Acute metabolic encephalopathy  Consultants: None  Procedures: None  Antibiotics: Anti-infectives (From admission, onward)   None      Subjective/Interval  History: Patient apparently had some sundowning last night.  Son is at the bedside this morning.  Patient much more responsive today compared to yesterday.  Son feels that she has improved.  Denies any complaints.  Still noted to be distracted.       Assessment/Plan:  Acute metabolic encephalopathy Thought to be secondary to dehydration, medications.  No obvious focal neurological deficits on exam.  CT head did not show any acute findings.  Chronic changes were noted.  Son was reassured.  Mentation appears to be improving.  Patient experiencing some sundowning.  Apparently improved with Haldol which can be continued as needed.  Speech therapy eval for swallow function.  PT and OT evaluation.  Acute diarrhea Likely acute gastroenteritis or could be due to antibiotics.  C. difficile as well as GI pathogen panel is negative.  Okay to discontinue enteric precautions.  Acute on chronic kidney disease stage IV Baseline creatinine around 1.8.  Presented with creatinine of 3.29.  Improvement in renal function this morning.  Creatinine is down to 2.47.  Continue to monitor urine output.  Continue IV fluids for additional 24 hours.  Continue to keep Lasix on hold for now.  Patient has chronic lower extremity edema which is actually much better per her son.    Abnormal UA Apparently recently treated for UTI with Bactrim.  Also recently treated for a dental infection.  Urine culture is growing Klebsiella.  Will hold off on antibiotics for now as there is no clear evidence that she has clinically significant urinary tract infection.  Macrocytic anemia/folic acid deficiency No evidence of overt bleeding.  Hemoglobin seems to be stable.  Anemia panel reviewed.  Ferritin 230, TIBC 141, iron 40, B12 421, folate is noted to be low at 2.0.  Initiate folate supplementation.    Hypoglycemia Due to poor oral intake.  Monitor periodically.  Seems to be improved.  History of paroxysmal atrial fibrillation Stable.   On amiodarone and metoprolol.  Continue Xarelto.  Dementia Stable except as mentioned above.  Obesity Estimated body mass index is 32.61 kg/m as calculated from the following:   Height as of this encounter: 5\' 4"  (1.626 m).   Weight as of this encounter: 86.2 kg.   DVT Prophylaxis: On rivaroxaban Code Status: DNR Family Communication: Discussed with her son Disposition Plan: Patient is here from an assisted living facility.  Anticipate return back to the same setting as long as she improves.  PT and OT evaluation.  Status is: Inpatient  Remains inpatient appropriate because:IV treatments appropriate due to intensity of illness or inability to take PO and Inpatient level of care appropriate due to severity of illness   Dispo:  Patient From: Home  Planned Disposition: ALF  Expected discharge date: 12/22/19  Medically stable for discharge: No      Medications:  Scheduled: . amiodarone  200 mg Oral Daily  . atorvastatin  10 mg Oral Daily  . donepezil  5 mg Oral QHS  . metoprolol succinate  25 mg Oral Daily  . pantoprazole  40 mg Oral Daily  . Rivaroxaban  15 mg Oral QAC supper   Continuous: . dextrose 5 % and 0.45% NaCl 75 mL/hr at 12/20/19 1722   PRN:   Objective:  Vital Signs  Vitals:   12/20/19 1300 12/20/19 1343 12/20/19 2101 12/21/19 0534  BP:  135/71 100/78 (!) 139/49  Pulse:  66 66 100  Resp:  18 16 16   Temp:  98.6 F (37 C) 98.4 F (36.9 C) 98.1 F (36.7 C)  TempSrc:  Oral Axillary Axillary  SpO2:  99% 100% 100%  Weight: 86.2 kg     Height:        Intake/Output Summary (Last 24 hours) at 12/21/2019 1116 Last data filed at 12/21/2019 0900 Gross per 24 hour  Intake 610 ml  Output 700 ml  Net -90 ml   Filed Weights   12/20/19 1300  Weight: 86.2 kg    General appearance: Much more responsive today compared to yesterday.  Not as distracted. Resp: Clear to auscultation bilaterally.  Normal effort Cardio: S1-S2 is normal regular.  No S3-S4.   No rubs murmurs or bruit GI: Abdomen is soft.  Nontender nondistended.  Bowel sounds are present normal.  No masses organomegaly Extremities: Chronic edema.  No changes according to son. Neurologic: Much more responsive.  Oriented to city.  She thought she was in [facility].  She thought of the year was 2022.  She was able to identify her son.  No obvious focal deficits noted.   Lab Results:  Data Reviewed: I have personally reviewed following labs and imaging studies  CBC: Recent Labs  Lab 12/19/19 1122 12/20/19 0335 12/21/19 0700  WBC 4.9 4.6 4.4  HGB 10.8* 9.8* 10.4*  HCT 34.2* 31.1* 32.1*  MCV 100.9* 100.3* 101.6*  PLT 255 205 263    Basic Metabolic Panel: Recent Labs  Lab 12/19/19 1122 12/20/19 0335 12/21/19 0700  NA 136 136 138  K 5.0 5.1 4.4  CL 103 106 108  CO2 23 18*  20*  GLUCOSE 88 61* 91  BUN 31* 32* 29*  CREATININE 3.29* 3.36* 2.47*  CALCIUM 8.1* 7.7* 8.1*    GFR: Estimated Creatinine Clearance: 16.4 mL/min (A) (by C-G formula based on SCr of 2.47 mg/dL (H)).  Liver Function Tests: Recent Labs  Lab 12/19/19 1122  AST 71*  ALT 38  ALKPHOS 88  BILITOT 1.0  PROT 6.4*  ALBUMIN 3.2*     CBG: Recent Labs  Lab 12/20/19 2056 12/20/19 2121 12/21/19 0129 12/21/19 0648 12/21/19 0830  GLUCAP 58* 80 83 84 93     Recent Results (from the past 240 hour(s))  SARS Coronavirus 2 by RT PCR (hospital order, performed in [facility] hospital lab) Nasopharyngeal Nasopharyngeal Swab     Status: None   Collection Time: 12/19/19  4:18 PM   Specimen: Nasopharyngeal Swab  Result Value Ref Range Status   SARS Coronavirus 2 NEGATIVE NEGATIVE Final    Comment: (NOTE) SARS-CoV-2 target nucleic acids are NOT DETECTED.  The SARS-CoV-2 RNA is generally detectable in upper and lower respiratory specimens during the acute phase of infection. The lowest concentration of SARS-CoV-2 viral copies this assay can detect is 250 copies / mL. A negative result  does not preclude SARS-CoV-2 infection and should not be used as the sole basis for treatment or other patient management decisions.  A negative result may occur with improper specimen collection / handling, submission of specimen other than nasopharyngeal swab, presence of viral mutation(s) within the areas targeted by this assay, and inadequate number of viral copies (<250 copies / mL). A negative result must be combined with clinical observations, patient history, and epidemiological information.  Fact Sheet for Patients:   StrictlyIdeas.no  Fact Sheet for Healthcare Providers: BankingDealers.co.za  This test is not yet approved or  cleared by the [place] FDA and has been authorized for detection and/or diagnosis of SARS-CoV-2 by FDA under an Emergency Use Authorization (EUA).  This EUA will remain in effect (meaning this test can be used) for the duration of the COVID-19 declaration under Section 564(b)(1) of the Act, 21 U.S.C. section 360bbb-3(b)(1), unless the authorization is terminated or revoked sooner.  Performed at [facility], [place] [address], [place], [place] [address]   Urine culture     Status: Abnormal (Preliminary result)   Collection Time: 12/19/19  7:50 PM   Specimen: Urine, Random  Result Value Ref Range Status   Specimen Description URINE, RANDOM  Final   Special Requests NONE  Final   Culture (A)  Final    >=100,000 COLONIES/mL KLEBSIELLA PNEUMONIAE IDENTIFICATION AND SUSCEPTIBILITIES TO FOLLOW Performed at [facility], [place] [address], [place], [place] [address]    Report Status PENDING  Incomplete  MRSA PCR Screening     Status: None   Collection Time: 12/20/19 12:52 AM   Specimen: Nasal Mucosa; Nasopharyngeal  Result Value Ref Range Status   MRSA by PCR NEGATIVE NEGATIVE Final    Comment:        The GeneXpert MRSA Assay (FDA approved for NASAL specimens only), is one component of  a comprehensive MRSA colonization surveillance program. It is not intended to diagnose MRSA infection nor to guide or monitor treatment for MRSA infections. Performed at [facility], [place] [address] [place], [place] [address]   Gastrointestinal Panel by PCR , Stool     Status: None   Collection Time: 12/20/19  1:08 AM   Specimen: Stool  Result Value Ref Range Status  Campylobacter species NOT DETECTED NOT DETECTED Final   Plesimonas shigelloides NOT DETECTED NOT DETECTED Final   Salmonella species NOT DETECTED NOT DETECTED Final   Yersinia enterocolitica NOT DETECTED NOT DETECTED Final   Vibrio species NOT DETECTED NOT DETECTED Final   Vibrio cholerae NOT DETECTED NOT DETECTED Final   Enteroaggregative E coli (EAEC) NOT DETECTED NOT DETECTED Final   Enteropathogenic E coli (EPEC) NOT DETECTED NOT DETECTED Final   Enterotoxigenic E coli (ETEC) NOT DETECTED NOT DETECTED Final   Shiga like toxin producing E coli (STEC) NOT DETECTED NOT DETECTED Final   Shigella/Enteroinvasive E coli (EIEC) NOT DETECTED NOT DETECTED Final   Cryptosporidium NOT DETECTED NOT DETECTED Final   Cyclospora cayetanensis NOT DETECTED NOT DETECTED Final   Entamoeba histolytica NOT DETECTED NOT DETECTED Final   Giardia lamblia NOT DETECTED NOT DETECTED Final   Adenovirus F40/41 NOT DETECTED NOT DETECTED Final   Astrovirus NOT DETECTED NOT DETECTED Final   Norovirus GI/GII NOT DETECTED NOT DETECTED Final   Rotavirus A NOT DETECTED NOT DETECTED Final   Sapovirus (I, II, IV, and V) NOT DETECTED NOT DETECTED Final    Comment: Performed at [facility], [address] [place], [place] [address]  C Difficile Quick Screen w PCR reflex     Status: None   Collection Time: 12/20/19  1:08 AM   Specimen: Stool  Result Value Ref Range Status   C Diff antigen NEGATIVE NEGATIVE Final   C Diff toxin NEGATIVE NEGATIVE Final   C Diff interpretation No C. difficile detected.  Final    Comment: Performed at  [facility], [place] [address] [place], [place] [address]      Radiology Studies: CT HEAD WO CONTRAST  Result Date: 12/20/2019 CLINICAL DATA:  Provided history: Mental status change, unknown cause. EXAM: CT HEAD WITHOUT CONTRAST TECHNIQUE: Contiguous axial images were obtained from the base of the skull through the vertex without intravenous contrast. COMPARISON:  MRI brain 04/21/2019, MRA head 04/22/2019, report from head CT 08/11/2002 (images unavailable). FINDINGS: Brain: Stable, mild generalized parenchymal atrophy. Known small chronic cortically based infarcts within the left frontal lobe were better appreciated on the prior MRI of 04/21/2019 (acute at that time). A known small chronic cortically based right parietal lobe infarct was also better appreciated on this prior study. Stable, mild ill-defined hypoattenuation within the cerebral white matter which is nonspecific, but consistent with chronic small vessel ischemic disease. Redemonstrated small chronic infarct within the right cerebellar hemisphere. There is no acute intracranial hemorrhage. No acute demarcated cortical infarct is identified. No extra-axial fluid collection. No evidence of intracranial mass. No midline shift. Vascular: No hyperdense vessel.  Atherosclerotic calcifications Skull: No calvarial fracture. Redemonstrated small osteoma projecting externally from the frontal calvarium. Sinuses/Orbits: Visualized orbits show no acute finding. Mild mucosal thickening and frothy secretions within the left sphenoid sinus. No significant mastoid effusion. IMPRESSION: No CT evidence of acute intracranial abnormality. Stable generalized parenchymal atrophy and chronic small vessel ischemic disease with multiple chronic infarcts as detailed. Left sphenoid sinusitis. Electronically Signed   By: Kellie Simmering DO   On: 12/20/2019 15:47       LOS: 1 day   [facility] Pager on [email or web address]  12/21/2019, 11:16  AM

## 2019-12-21 NOTE — TOC Initial Note (Signed)
Transition of Care [facility]) - Initial/Assessment Note    Patient Details  Name: Christine Conley MRN: [id number] Date of Birth: 1928/08/05  Transition of [facility]) CM/SW Contact:    Geralynn Ochs, LCSW Phone Number: 12/21/2019, 3:07 PM  Clinical Narrative:     CSW spoke with patient's son, Chrissie Noa, over the phone to discuss discharge plans. CSW discussed with son the difference between return to ALF with [facility] vs SNF placement, and son debated both options for some time. Son indicated initial preference for return to ALF with HH due to patient's mental status, but then discussed how the patient might get more supervision and assist in addition to the increased therapy at SNF. Son ultimately decided on preference for SNF after discussion, with preference for [facility] or [facility]. CSW to send referral and update son with bed availability.              Expected Discharge Plan: Skilled Nursing Facility Barriers to Discharge: Continued Medical Work up   Patient Goals and CMS Choice Patient states their goals for this hospitalization and ongoing recovery are:: patient unable to participate in goal setting due to disorientation CMS Medicare.gov Compare Post Acute Care list provided to:: Patient Represenative (must comment) Choice offered to / list presented to : Adult Children  Expected Discharge Plan and Services Expected Discharge Plan: [place] Choice: Friend Living arrangements for the past 2 months: [place]                                      Prior Living Arrangements/Services Living arrangements for the past 2 months: [place] Lives with:: Facility Resident Patient language and need for interpreter reviewed:: No Do you feel safe going back to the place where you live?: Yes      Need for Family Participation in Patient Care: Yes (Comment) Care giver support system in place?: No  (comment) Current home services: DME Criminal Activity/Legal Involvement Pertinent to Current Situation/Hospitalization: No - Comment as needed  Activities of Daily Living      Permission Sought/Granted Permission sought to share information with : Facility Sport and exercise psychologist, Family Supports Permission granted to share information with : Yes, Verbal Permission Granted  Share Information with NAME: Chrissie Noa  Permission granted to share info w AGENCY: SNF, [facility]  Permission granted to share info w Relationship: Son     Emotional Assessment   Attitude/Demeanor/Rapport: Unable to Assess Affect (typically observed): Unable to Assess Orientation: : Oriented to Self Alcohol / Substance Use: Not Applicable Psych Involvement: No (comment)  Admission diagnosis:  Diarrhea of presumed infectious origin [R19.7] AKI (acute kidney injury) ([place]) [N17.9] AMS (altered mental status) [R41.82] ARF (acute renal failure) ([place]) [N17.9] Patient Active Problem List   Diagnosis Date Noted  . Acute-on-chronic kidney injury ([place]) 12/20/2019  . ARF (acute renal failure) ([place]) 12/20/2019  . AMS (altered mental status) 12/19/2019  . Bilateral carotid artery stenosis 04/24/2019  . Moderate aortic valve stenosis 04/22/2019  . Diastolic dysfunction without heart failure 04/22/2019  . CVA (cerebral vascular accident) ([place]) 04/21/2019  . Chest tightness 08/19/2017  . Hypertension 08/19/2017  . Atrial fibrillation ([place]) 08/19/2017  . Anxiety 08/19/2017  . Acid reflux 08/19/2017  . Closed fracture of right proximal humerus 08/18/2017  . Fracture of humerus, proximal, right, closed 08/10/2017  . Memory loss 02/10/2017  PCP:  [name], MD Pharmacy:   [facility], [place] AT [facility] [address] [place] [address] Phone: [phone] Fax: [phone]     Social Determinants of Health (SDOH)  Interventions    Readmission Risk Interventions No flowsheet data found.

## 2019-12-21 NOTE — Evaluation (Signed)
Physical Therapy Evaluation Patient Details Name: Christine Conley MRN: [id number] DOB: 01-21-1929 Today's Date: 12/21/2019   History of Present Illness  86 y.o. female with medical history significant for dementia, atrial fibrillation on Xarelto, CVA, moderate aortic valve stenosis, CKD stage IV and hyperlipidemia who presented with concerns of altered mental status with hallucinations, found to have UTI, and diarrhea but negative for c-diff.  Clinical Impression  Patient presents with decreased mobility due to pain in knee/hip on the R and ankle on L and with prolonged bedrest with deconditioning.  She normally moves with rollator at ALF on her own and has help for ADL's per son.  Today needing min A for OOB to chair with lots of encouragement due to pain.  Feel she may benefit from follow up [place] at facility if staff able to provide level of care.  PT to follow acutely.     Follow Up Recommendations Home health PT    Equipment Recommendations  None recommended by PT    Recommendations for Other Services       Precautions / Restrictions Precautions Precautions: Fall      Mobility  Bed Mobility Overal bed mobility: Needs Assistance Bed Mobility: Rolling;Sidelying to Sit Rolling: Mod assist Sidelying to sit: Mod assist       General bed mobility comments: assist to roll for hygiene due to soiled in bed, cues for technique and using rail, assist for legs off bed and cues to use rail and assist to lift trunk  Transfers Overall transfer level: Needs assistance Equipment used: Rolling walker (2 wheeled) Transfers: Sit to/from [employer] Transfers Sit to Stand: Min assist;From elevated surface Stand pivot transfers: Min assist       General transfer comment: assist to stand from EOB slightly higher due to low bed, pt c/o pain backs of thighs and needed encouragement to step to recliner with RW. min A for balance  Ambulation/Gait                Stairs             Wheelchair Mobility    Modified Rankin (Stroke Patients Only)       Balance Overall balance assessment: Needs assistance Sitting-balance support: Feet supported Sitting balance-Leahy Scale: Fair     Standing balance support: Bilateral upper extremity supported Standing balance-Leahy Scale: Poor Standing balance comment: UE support and assist for balance                             Pertinent Vitals/Pain Pain Assessment: Faces Faces Pain Scale: Hurts even more Pain Location: generalized, L foot, R knee, back Pain Descriptors / Indicators: Grimacing;Aching Pain Intervention(s): Monitored during session;Repositioned    Home Living Family/patient expects to be discharged to:: Assisted living                      Prior Function Level of Independence: Needs assistance   Gait / Transfers Assistance Needed: walks with rollator, at baseline independent, but last week trouble due to L foot/ankle pain  ADL's / Homemaking Assistance Needed: assist from staff for bathing, dressing        Hand Dominance        Extremity/Trunk Assessment   Upper Extremity Assessment Upper Extremity Assessment: LUE deficits/detail;RUE deficits/detail RUE Deficits / Details: AROM Shoulder flexion about 95 and c/o pain, strength grossly 4-/5 LUE Deficits / Details: AROM Shoulder flexion about 95 and c/o pain,  strength grossly 4-/5    Lower Extremity Assessment Lower Extremity Assessment: RLE deficits/detail;LLE deficits/detail RLE Deficits / Details: AAROM knee flexion limited due to pain in hip, strength 3/5, ankle WFL LLE Deficits / Details: AAROM WFL, strength 3+/5, ankle limited due to pain    Cervical / Trunk Assessment Cervical / Trunk Assessment: Kyphotic  Communication   Communication: No difficulties  Cognition Arousal/Alertness: Awake/alert Behavior During Therapy: Anxious Overall Cognitive Status: History of cognitive impairments - at baseline                                         General Comments General comments (skin integrity, edema, etc.): son in the room and reports hopes pt can return to ALF to aide in her progressing to baseline    Exercises Other Exercises Other Exercises: AAROM knee flexion x 5 each   Assessment/Plan    PT Assessment Patient needs continued PT services  PT Problem List Decreased strength;Decreased mobility;Decreased activity tolerance;Decreased balance;Decreased knowledge of use of DME;Decreased range of motion;Pain       PT Treatment Interventions DME instruction;Therapeutic activities;Therapeutic exercise;Patient/family education;Functional mobility training;Gait training    PT Goals (Current goals can be found in the Care Plan section)  Acute Rehab PT Goals Patient Stated Goal: to return to facility PT Goal Formulation: With patient/family Time For Goal Achievement: 01/04/20 Potential to Achieve Goals: Good    Frequency Min 2X/week   Barriers to discharge        Co-evaluation               AM-PAC PT "6 Clicks" Mobility  Outcome Measure Help needed turning from your back to your side while in a flat bed without using bedrails?: A Little Help needed moving from lying on your back to sitting on the side of a flat bed without using bedrails?: A Little Help needed moving to and from a bed to a chair (including a wheelchair)?: A Little Help needed standing up from a chair using your arms (e.g., wheelchair or bedside chair)?: A Little Help needed to walk in hospital room?: A Little Help needed climbing 3-5 steps with a railing? : Total 6 Click Score: 16    End of Session Equipment Utilized During Treatment: Gait belt Activity Tolerance: Patient limited by pain Patient left: in chair;with call bell/phone within reach;with family/visitor present;with chair alarm set Nurse Communication: Mobility status PT Visit Diagnosis: Muscle weakness (generalized) (M62.81);Other  abnormalities of gait and mobility (R26.89);Pain Pain - Right/Left: Left Pain - part of body: Ankle and joints of foot    Time: 1400-1432 PT Time Calculation (min) (ACUTE ONLY): 32 min   Charges:   PT Evaluation $PT Eval Moderate Complexity: 1 Mod PT Treatments $Therapeutic Activity: 8-22 mins        Magda Kiel, PT Acute Rehabilitation Services RCVEL:[phone] Office:[phone] 12/21/2019   Christine Conley 12/21/2019, 3:57 PM

## 2019-12-22 LAB — BASIC METABOLIC PANEL
Anion gap: 5 (ref 5–15)
BUN: 28 mg/dL — ABNORMAL HIGH (ref 8–23)
CO2: 21 mmol/L — ABNORMAL LOW (ref 22–32)
Calcium: 7.8 mg/dL — ABNORMAL LOW (ref 8.9–10.3)
Chloride: 109 mmol/L (ref 98–111)
Creatinine, Ser: 2.32 mg/dL — ABNORMAL HIGH (ref 0.44–1.00)
GFR calc Af Amer: 21 mL/min — ABNORMAL LOW (ref >60)
GFR calc non Af Amer: 18 mL/min — ABNORMAL LOW (ref >60)
Glucose, Bld: 130 mg/dL — ABNORMAL HIGH (ref 70–99)
Potassium: 4.4 mmol/L (ref 3.5–5.1)
Sodium: 135 mmol/L (ref 135–145)

## 2019-12-22 LAB — SARS CORONAVIRUS 2 BY RT PCR (HOSPITAL ORDER, PERFORMED IN ~~LOC~~ HOSPITAL LAB): SARS Coronavirus 2: NEGATIVE

## 2019-12-22 LAB — GLUCOSE, CAPILLARY
Glucose-Capillary: 109 mg/dL — ABNORMAL HIGH (ref 70–99)
Glucose-Capillary: 106 mg/dL — ABNORMAL HIGH (ref 70–99)
Glucose-Capillary: 110 mg/dL — ABNORMAL HIGH (ref 70–99)

## 2019-12-22 LAB — CBC
HCT: 28.7 % — ABNORMAL LOW (ref 36.0–46.0)
Hemoglobin: 9.3 g/dL — ABNORMAL LOW (ref 12.0–15.0)
MCH: 32.7 pg (ref 26.0–34.0)
MCHC: 32.4 g/dL (ref 30.0–36.0)
MCV: 101.7 fL — ABNORMAL HIGH (ref 80.0–100.0)
Platelets: 197 10*3/uL (ref 150–400)
RBC: 2.84 MIL/uL — ABNORMAL LOW (ref 3.87–5.11)
RDW: 15 % (ref 11.5–15.5)
WBC: 4.1 10*3/uL (ref 4.0–10.5)
nRBC: 0 % (ref 0.0–0.2)

## 2019-12-22 NOTE — Evaluation (Signed)
Occupational Therapy Evaluation Patient Details Name: Christine Conley MRN: [id number] DOB: 1928/09/05 Today's Date: 12/22/2019    History of Present Illness 86 y.o. female with medical history significant for dementia, atrial fibrillation on Xarelto, CVA, moderate aortic valve stenosis, CKD stage IV and hyperlipidemia who presented with concerns of altered mental status with hallucinations, found to have UTI, and diarrhea but negative for c-diff.   Clinical Impression   Patient in bed, son at bedside.  Patient agreed to up and seated grooming this date.  Patient presents with continue L ankle pain, baseline cognitive dysfunction, declines to bed mobility, sit to stand and basic transfers.  Patient's PLOF was assist as needed for showers at ALF, she was Mod Independent with 2WRW, however, son states she was declining due to recent ankle injury.  Son is hoping for ST rehab at SNF to maximize strength and mobility prior to transitioning back to ALF with [facility] based services.  Continue to follow in acute setting for UB ADL and functional mobility.      Follow Up Recommendations  SNF    Equipment Recommendations       Recommendations for Other Services       Precautions / Restrictions Precautions Precautions: Fall Precaution Comments: pure wick Restrictions Weight Bearing Restrictions: No      Mobility Bed Mobility                  Transfers   Equipment used: Rolling walker (2 wheeled) Transfers: Sit to/from Stand;Stand Pivot Transfers Sit to Stand: Min assist;From elevated surface Stand pivot transfers: Min guard            Balance Overall balance assessment: Needs assistance Sitting-balance support: Feet supported Sitting balance-Leahy Scale: Fair     Standing balance support: Bilateral upper extremity supported Standing balance-Leahy Scale: Fair                             ADL either performed or assessed with clinical judgement   ADL Overall ADL's :  Needs assistance/impaired Eating/Feeding: Set up;Sitting   Grooming: Wash/dry hands;Wash/dry face;Brushing hair;Sitting;Set up;Supervision/safety   Upper Body Bathing: Minimal assistance;Sitting     Lower Body Bathing Details (indicate cue type and reason): assist PLOF Upper Body Dressing : Moderate assistance;Sitting     Lower Body Dressing Details (indicate cue type and reason): assist PLOF  unable to reach B feet. Toilet Transfer: Min guard;Minimal assistance;Ambulation           Functional mobility during ADLs: Min guard;Rolling walker       Vision Baseline Vision/History: No visual deficits Patient Visual Report: No change from baseline       Perception     Praxis      Pertinent Vitals/Pain Pain Assessment: 0-10 Pain Score: 3  Faces Pain Scale: Hurts a little bit Pain Descriptors / Indicators: Grimacing;Aching Pain Intervention(s): Monitored during session     Hand Dominance Right   Extremity/Trunk Assessment Upper Extremity Assessment Upper Extremity Assessment: Generalized weakness RUE Deficits / Details: AROM Shoulder flexion about 95 and c/o pain, strength grossly 4-/5 LUE Deficits / Details: AROM Shoulder flexion about 95 and c/o pain, strength grossly 4-/5   Lower Extremity Assessment Lower Extremity Assessment: Defer to PT evaluation       Communication Communication Communication: No difficulties   Cognition Arousal/Alertness: Awake/alert Behavior During Therapy: WFL for tasks assessed/performed Overall Cognitive Status: History of cognitive impairments - at baseline  General Comments: following multi step commands.  [facility]   General Comments       Exercises     Shoulder Instructions      Home Living Family/patient expects to be discharged to:: Assisted living                             Home Equipment: Walker - 2 wheels;Shower seat   Additional Comments: son is hoping for ST  rehab prior to returning to ALF with [facility]      Prior Functioning/Environment Level of Independence: Needs assistance  Gait / Transfers Assistance Needed: walks with rollator, at baseline independent, but last week trouble due to L foot/ankle pain ADL's / Homemaking Assistance Needed: assist from staff for bathing, dressing   Comments: pt is independent with use of Rollator, but recently increasing falls with L ankle pain.        OT Problem List: Decreased strength;Decreased activity tolerance;Decreased safety awareness;Decreased knowledge of use of DME or AE;Pain      OT Treatment/Interventions: Self-care/ADL training;Therapeutic exercise;Therapeutic activities;Balance training    OT Goals(Current goals can be found in the care plan section) Acute Rehab OT Goals OT Goal Formulation: With patient/family Time For Goal Achievement: 01/05/20 Potential to Achieve Goals: Fair ADL Goals Pt Will Perform Grooming: with set-up;with supervision;with min guard assist;sitting Pt Will Perform Upper Body Bathing: with set-up;with supervision;with min guard assist;sitting Pt Will Perform Upper Body Dressing: with set-up;with supervision;sitting Pt Will Transfer to Toilet: with min guard assist;ambulating Pt Will Perform Toileting - Clothing Manipulation and hygiene: with min guard assist;sit to/from stand Pt/caregiver will Perform Home Exercise Program: Both right and left upper extremity;Increased strength;With Supervision  OT Frequency: Min 2X/week   Barriers to D/C:    Needs to toilet independently at ALF       Co-evaluation              AM-PAC OT "6 Clicks" Daily Activity     Outcome Measure Help from another person eating meals?: None Help from another person taking care of personal grooming?: A Little Help from another person toileting, which includes using toliet, bedpan, or urinal?: A Little Help from another person bathing (including washing, rinsing, drying)?: A Lot Help from  another person to put on and taking off regular upper body clothing?: A Little Help from another person to put on and taking off regular lower body clothing?: A Lot 6 Click Score: 17   End of Session Equipment Utilized During Treatment: Gait belt;Rolling walker Nurse Communication: Mobility status  Activity Tolerance: Patient tolerated treatment well;No increased pain Patient left: in chair;with call bell/phone within reach;with chair alarm set  OT Visit Diagnosis: Unsteadiness on feet (R26.81);Other abnormalities of gait and mobility (R26.89);Repeated falls (R29.6);Pain Pain - Right/Left: Left Pain - part of body: Ankle and joints of foot                Time: 1000-1027 OT Time Calculation (min): 27 min Charges:  OT General Charges $OT Visit: 1 Visit OT Evaluation $OT Eval Moderate Complexity: 1 Mod  12/22/2019  Rich, OTR/L  Acute Rehabilitation Services  Office:  [place] 12/22/2019, 11:04 AM

## 2019-12-22 NOTE — Plan of Care (Signed)

## 2019-12-22 NOTE — Progress Notes (Signed)
PROGRESS NOTE  Christine Conley  DOB: 10/31/1928  PCP: [name], MD [id number]  DOA: 12/19/2019  LOS: 2 days   Chief Complaint  Patient presents with  . Altered Mental Status   Brief narrative: Patient is an 86 y.o.femalewith PMH significant fordementia, atrial fibrillation on Xarelto, CVA, moderate aortic valve stenosis, CKD stage IV and hyperlipidemia who presented with concerns of altered mental status. At baseline, patient is oriented to self, able to ambulate with a walker.  Per history, patient has issues with lower extremity edema for some time and her primary physician adjusts her Lasix on and off due to renal insufficiency.Recently she was diagnosed with UTI for which she completed the course of antibiotics 2 days prior to presentation.  She started having diarrhea after that. For a week prior to presentation, patient was having ankle pain, unable to ambulate, mostly confined to her room because of which she has gotten progressively weaker.  Family also noticed progressively worsening episodes of hallucinations last several days.    In the ED, patient was afebrile and hemodynamically stable. WBC was normal, hemoglobin 10.8, creatinine elevated to 2.29, baseline 1.89, troponin of 13. UA showed small leukocyte, negative nitrite, many bacteria and many hyaline casts.  Subjective: Patient was seen and examined sitting up in chair.  Not in distress.  Breathing comfortably on room air.  Alert, awake, oriented to place and person.  Not to time Chart reviewed No fever, blood pressure 108/46 this morning, oxygen saturation 99% on room air.  Assessment/Plan: Acute metabolic encephalopathy -Patient presented with worsening frequency of delirium, hallucinations. -Multifactorial: Underlying dementia, recent UTI, decreased mobility, diarrhea. -Mental status is improving.  Generalized weakness -Secondary to UTI, diarrhea, ankle pain.  Pain control, PT OT eval  Acute  diarrhea -Started after completion of antibiotic course.  C. difficile negative however.  GI panel negative. -Diarrhea has improved.  ESBL E. coli in urine -More than 100,000 CFU per mL of ESBL Klebsiella pneumoniae in the urine. -Only sensitive to imipenem. -Patient denies any urinary burning, frequency, hesitancy, lower abdominal pain, fever -At this time, will consider it as colonization and not start on any antibiotics.  Acute on chronic kidney disease stage IV -Baseline creatinine around 1.8.  Presented with creatinine of 3.29.  -Gradually improving, 2.32 this morning.  Continue to monitor. -Continue to keep Lasix on hold Recent Labs    04/21/19 1233 04/22/19 0714 04/23/19 0445 12/19/19 1122 12/20/19 0335 12/21/19 0700 12/22/19 0207  CREATININE 1.99* 1.80* 1.89* 3.29* 3.36* 2.47* 2.32*   Chronic bilateral lower extremity edema -Difficult to assess Lasix dose because of coexisting CKD. -May benefit from TED hose.  Macrocytic anemia/folic acid deficiency -No active bleeding, baseline hemoglobin seem to be 9-10.  Folic acid deficiency noted.  Started on supplementation. Recent Labs    04/21/19 1233 12/19/19 1122 12/20/19 0335 12/21/19 0700 12/22/19 0207  HGB 11.6* 10.8* 9.8* 10.4* 9.3*   Recent Labs    12/21/19 0700  VITAMINB12 421  FOLATE 2.0*  FERRITIN 230  TIBC 141*  IRON 40  RETICCTPCT 1.6   Hypoglycemia Recent Labs  Lab 12/21/19 0830 12/21/19 1210 12/21/19 1703 12/21/19 2108 12/22/19 0735  GLUCAP 93 183* 126* 133* 109*   History of paroxysmal atrial fibrillation Stable.  On amiodarone and metoprolol.  Continue Xarelto.  Mobility: PT evaluation obtained.  Son chose SNF Code Status:   Code Status: DNR  Nutritional status: Body mass index is 32.61 kg/m.     Diet Order  DIET - DYS 1 Room service appropriate? No; Fluid consistency: Thin  Diet effective now                 DVT prophylaxis:  Rivaroxaban (XARELTO) tablet  15 mg   Antimicrobials:  None Fluid: D5 half NS at 75 mL/h. Consultants: None Family Communication:  Called and updated patient's son Mr. Pennelope Basque today.  Status is: Inpatient  Remains inpatient appropriate because:Persistent severe electrolyte disturbances, Ongoing diagnostic testing needed not appropriate for outpatient work up and IV treatments appropriate due to intensity of illness or inability to take PO   Dispo:  Patient From: Home  Planned Disposition: ALF  Expected discharge date: 12/22/19  Medically stable for discharge: No        Infusions:  . dextrose 5 % and 0.45% NaCl 75 mL/hr at 12/21/19 2115    Scheduled Meds: . amiodarone  200 mg Oral Daily  . atorvastatin  10 mg Oral Daily  . donepezil  5 mg Oral QHS  . folic acid  1 mg Oral Daily  . metoprolol succinate  25 mg Oral Daily  . pantoprazole  40 mg Oral Daily  . Rivaroxaban  15 mg Oral QAC supper    Antimicrobials: Anti-infectives (From admission, onward)   None      PRN meds: haloperidol lactate   Objective: Vitals:   12/21/19 2001 12/22/19 0506  BP: (!) 127/39 (!) 108/46  Pulse: (!) 58 60  Resp: 16 16  Temp: 97.9 F (36.6 C) 97.7 F (36.5 C)  SpO2: 97% 99%    Intake/Output Summary (Last 24 hours) at 12/22/2019 0911 Last data filed at 12/22/2019 0520 Gross per 24 hour  Intake 1666.36 ml  Output 300 ml  Net 1366.36 ml   Filed Weights   12/20/19 1300  Weight: 86.2 kg   Weight change:  Body mass index is 32.61 kg/m.   Physical Exam: General exam: Appears calm and comfortable.  Not in physical distress Skin: No rashes, lesions or ulcers. HEENT: Atraumatic, normocephalic, supple neck, no obvious bleeding Lungs: Clear to auscultation bilaterally CVS: Regular rate and rhythm, no murmur GI/Abd soft, nontender, nondistended, bowel sound present CNS: Alert, awake, oriented to place and person Psychiatry: Mood appropriate Extremities: Improving bilateral pedal edema, skin  puckering noted  Data Review: I have personally reviewed the laboratory data and studies available.  Recent Labs  Lab 12/19/19 1122 12/20/19 0335 12/21/19 0700 12/22/19 0207  WBC 4.9 4.6 4.4 4.1  HGB 10.8* 9.8* 10.4* 9.3*  HCT 34.2* 31.1* 32.1* 28.7*  MCV 100.9* 100.3* 101.6* 101.7*  PLT 255 205 211 197   Recent Labs  Lab 12/19/19 1122 12/20/19 0335 12/21/19 0700 12/22/19 0207  NA 136 136 138 135  K 5.0 5.1 4.4 4.4  CL 103 106 108 109  CO2 23 18* 20* 21*  GLUCOSE 88 61* 91 130*  BUN 31* 32* 29* 28*  CREATININE 3.29* 3.36* 2.47* 2.32*  CALCIUM 8.1* 7.7* 8.1* 7.8*   Signed, Terrilee Croak, MD [facility] Pager: [phone] (Secure Chat preferred). 12/22/2019

## 2019-12-22 NOTE — Progress Notes (Signed)
PIV consult: IV fluids discontinued, per RN. No need for IV at this time. Nurse will re-enter consult if new IV meds ordered.

## 2019-12-22 NOTE — Progress Notes (Signed)
  Speech Language Pathology Treatment: Dysphagia  Patient Details Name: Christine Conley MRN: [id number] DOB: 08-02-1928 Today's Date: 12/22/2019 Time: 4765-4650 SLP Time Calculation (min) (ACUTE ONLY): 10 min  Assessment / Plan / Recommendation Clinical Impression  Pt sitting in recliner.  Communicative and pleasant.  Has tolerated pureed diet/thin liquids without incident.  Continues to be unable to masticate crackers without teeth (she states "too hard!) but oral control/manipulation of soft solids/thin liquids suggest her diet could be advanced to mechanical soft with chopped meats. Her mentation appears to be improved since time of initial evaluation on 9/1, there are no focal CN deficits impacting motor or sensory function.  She is handling thin liquids well without concern for aspiration.  Advance to dysphagia 3, thins. No SLP f/u needed.   HPI HPI: 86yo female admitted 12/19/19 from ALF with AMS, including hallucinations. PMH: dementia, AFib, CVA, moderate aortic stenosis, CKD4, HLD, GERD, anxiety. MRI = 2 small acute left frontal lobe cortical infarcts, mild generalized parenchymal atrophy and chronic small vessel ischemic disease, small chronic lacunar infarcts within the thalami and cerebellum.      SLP Plan  Goal met       Recommendations  Diet recommendations: Dysphagia 3;Thin liquid Liquids provided via: Cup;Straw Medication Administration: Crushed with puree Supervision: Patient able to self feed (tray set-up) Compensations: Minimize environmental distractions Postural Changes and/or Swallow Maneuvers: Seated upright 90 degrees                Oral Care Recommendations: Oral care BID SLP Visit Diagnosis: Dysphagia, unspecified (R13.10) Plan: no slp f/u       Christine Conley, Christine Conley                Christine Conley 12/22/2019, 2:37 PM

## 2019-12-23 DIAGNOSIS — I350 Nonrheumatic aortic (valve) stenosis: Secondary | ICD-10-CM

## 2019-12-23 DIAGNOSIS — E538 Deficiency of other specified B group vitamins: Secondary | ICD-10-CM

## 2019-12-23 DIAGNOSIS — F0390 Unspecified dementia without behavioral disturbance: Secondary | ICD-10-CM

## 2019-12-23 DIAGNOSIS — N189 Chronic kidney disease, unspecified: Secondary | ICD-10-CM

## 2019-12-23 DIAGNOSIS — E785 Hyperlipidemia, unspecified: Secondary | ICD-10-CM

## 2019-12-23 DIAGNOSIS — G9341 Metabolic encephalopathy: Secondary | ICD-10-CM

## 2019-12-23 LAB — BASIC METABOLIC PANEL
Anion gap: 6 (ref 5–15)
BUN: 23 mg/dL (ref 8–23)
CO2: 19 mmol/L — ABNORMAL LOW (ref 22–32)
Calcium: 7.8 mg/dL — ABNORMAL LOW (ref 8.9–10.3)
Chloride: 107 mmol/L (ref 98–111)
Creatinine, Ser: 2.07 mg/dL — ABNORMAL HIGH (ref 0.44–1.00)
GFR calc Af Amer: 24 mL/min — ABNORMAL LOW (ref >60)
GFR calc non Af Amer: 21 mL/min — ABNORMAL LOW (ref >60)
Glucose, Bld: 102 mg/dL — ABNORMAL HIGH (ref 70–99)
Potassium: 4.4 mmol/L (ref 3.5–5.1)
Sodium: 132 mmol/L — ABNORMAL LOW (ref 135–145)

## 2019-12-23 LAB — CBC
HCT: 28.2 % — ABNORMAL LOW (ref 36.0–46.0)
Hemoglobin: 9 g/dL — ABNORMAL LOW (ref 12.0–15.0)
MCH: 32 pg (ref 26.0–34.0)
MCHC: 31.9 g/dL (ref 30.0–36.0)
MCV: 100.4 fL — ABNORMAL HIGH (ref 80.0–100.0)
Platelets: 138 10*3/uL — ABNORMAL LOW (ref 150–400)
RBC: 2.81 MIL/uL — ABNORMAL LOW (ref 3.87–5.11)
RDW: 15 % (ref 11.5–15.5)
WBC: 3.6 10*3/uL — ABNORMAL LOW (ref 4.0–10.5)
nRBC: 0 % (ref 0.0–0.2)

## 2019-12-23 LAB — GLUCOSE, CAPILLARY
Glucose-Capillary: 81 mg/dL (ref 70–99)
Glucose-Capillary: 88 mg/dL (ref 70–99)

## 2019-12-23 MED ORDER — FOLIC ACID 1 MG PO TABS: 1.0000 mg | ORAL_TABLET | Freq: Every day | ORAL | 0 refills | Status: AC

## 2019-12-23 NOTE — TOC Progression Note (Signed)
Transition of Care [facility]) - Progression Note    Patient Details  Name: Christine Conley MRN: [id number] Date of Birth: 1928-11-15  Transition of Care [facility]) CM/SW [place], [place] Phone Number: 12/23/2019, 8:28 AM  Clinical Narrative:    CSW spoke with pt son, pt son has chosen [facility]. He will fill out paperwork with Narda Rutherford at 1:30pm, per MD pt likely ready for d/c tomorrow.  Pt son aware, new COVID ordered. Pending medical stability for discharge.  Expected Discharge Plan: [place] Barriers to Discharge: Continued Medical Work up  Expected Discharge Plan and Services Expected Discharge Plan: [place] Choice: [place] arrangements for the past 2 months: [place]  Readmission Risk Interventions No flowsheet data found.

## 2019-12-23 NOTE — TOC Transition Note (Signed)
Transition of Care [facility]) - CM/SW Discharge Note   Patient Details  Name: Christine Conley MRN: [id number] Date of Birth: 03/07/1929  Transition of [facility]) CM/SW Contact:  Alexander Mt, LCSW Phone Number: 12/23/2019, 10:40 AM   Clinical Narrative:    Pt stable for d/c per Dr. Cathlean Sauer. Discharge summary and paperwork sent to SNF. DNR signed on chart, PTAR papers placed on chart. PTAR requested for 1:30pm at [facility] request. Pt son Christine Conley aware, he will meet pt at [facility] with items from [employer]. No controlled medications noted on d/c summary.   Final next level of care: Skilled Nursing Facility Barriers to Discharge: Barriers Resolved   Patient Goals and CMS Choice Patient states their goals for this hospitalization and ongoing recovery are:: patient unable to participate in goal setting due to disorientation CMS Medicare.gov Compare Post Acute Care list provided to:: Patient Represenative (must comment) (pt son Christine Conley) Choice offered to / list presented to : Adult Children  Discharge Placement   Existing PASRR number confirmed : 12/21/19          Patient chooses bed at: [facility] Patient to be transferred to facility by: [place] Name of family member notified: pt son Christine Conley Patient and family notified of of transfer: 12/23/19  Discharge Plan and Services Post Acute Care Choice: [place]          Readmission Risk Interventions Readmission Risk Prevention Plan 12/23/2019  Transportation Screening Complete  PCP or Specialist Appt within 5-7 Days Not Complete  Not Complete comments plan for SNF before return to [place] Screening Complete  Medication Review (RN CM) Referral to Pharmacy  Some recent data might be hidden

## 2019-12-23 NOTE — Discharge Summary (Addendum)
Physician Discharge Summary  Christine Conley PTW:[id number] DOB: 10-27-1928 DOA: 12/19/2019  PCP: [name], MD  Admit date: 12/19/2019 Discharge date: 12/23/2019  Admitted From: ALF Disposition:  SNF   Recommendations for Outpatient Follow-up and new medication changes:  1. Follow up with Dr. Melford Aase in 7 days.  2. Hold on furosemide for now. 3. Added folic acid supplementation.   Home Health: na  Equipment/Devices: na    I spoke with patient's son at the bedside, we talked in detail about patient's condition, plan of care and prognosis and all questions were addressed.   Discharge Condition: stable  CODE STATUS: dnr   Diet recommendation:   Diet recommendations: Dysphagia 3;Thin liquid Liquids provided via: Cup;Straw Medication Administration: Crushed with puree Supervision: Patient able to self feed (tray set-up) Compensations: Minimize environmental distractions Postural Changes and/or Swallow Maneuvers: Seated upright 90 degrees       Brief/Interim Summary: Patient admitted to the hospital with a working diagnosis of acute metabolic encephalopathy.  86 year old female with a past medical history for dementia, atrial fibrillation, history of CVA, moderate aortic valve stenosis, chronic kidney disease stage IV and dyslipidemia.  She was noted to have difficulty ambulating, hand tremors and hallucinations.  Recently she was treated for urinary tract infection as an outpatient.  On her initial physical examination blood pressure 145/51, heart rate 73, respiratory rate 27, oxygen saturation 95%.  Her lungs are clear to auscultation bilaterally, heart S1-S2, present and rhythmic, her abdomen was soft, no lower extremity edema.  She was awake and alert, following commands, but noted with visual hallucinations. Sodium 136, potassium 5.0, chloride 103, bicarb 23, glucose 88, BUN 31, creatinine 3.29, AST 71, ALT 38, white count 4.9, hemoglobin 10.8, hematocrit 34.2, platelets 155.   SARS COVID-19 negative.  Urine analysis showed 0-5 white cells, 0-5 red cells, specific gravity 1.014, negative nitrates. Head CT no acute changes, stable generalized parenchymal atrophy and chronic small vessel ischemic disease with multiple chronic infarcts. EKG 59 bpm, normal axis, normal intervals, sinus rhythm, Q-wave V1-V2, no ST segment or T wave changes.  Patient received supportive medical therapy with good toleration.  Further work-up, stool was negative for C. Difficile.  Urine culture was positive for ESBL Klebsiella pneumonia, considered to be a colonization.  Patient was seen by physical therapy/Occupational Therapy, recommendations for transfer to a skilled nursing facility to continue recovery.  On September 4 she was back to her baseline, no further hallucinations.   1.  Acute, multifactorial metabolic encephalopathy with delirium/ in the setting of dementia, present on admission.  Her mentation has been improving with supportive medical therapy.  Diuretic therapy has been discontinued.  No current indication for antibiotic therapy.  Patient will continue physical therapy at the skilled nursing facility. Speech therapy recommended dysphagia 3 diet and continue with aspiration precautions.   Continue with donepezil.   2.  Acute diarrheal illness.  Clinically improved, rule out for C. Difficile.  3.  Colonized urine with Klebsiella ESBL.  Patient had no urinary symptoms, no pyuria, no antibiotic therapy indicated.  4.  Acute kidney injury on chronic kidney disease stage IV/hyperkalemia, hyponatremia.  Patient received IV fluids, diuretic therapy was withheld.  At discharge patient is tolerating p.o. diet adequately. Sodium 132, potassium 4.4, chloride 107, bicarb 19, glucose 102, BUN 23, creatinine 2.0.   Continue close follow-up of kidney function as an outpatient.  5.  Aortic valve stenosis, hypertension, paroxysmal atrial fibrillation.  Continue rate control with amiodarone  and metoprolol, anticoagulation  with rivaroxaban  6.  Anemia of chronic disease, folic acid deficiency.  Serum iron 40, TIBC 141, transferrin saturation 28, ferritin 230, folate 2.0. Patient received folic acid supplementation.  Follow-up cell count as an outpatient.  7. Dyslipidemia. Continue with atorvastatin.   8. Hx of CVA. Continue with statin and anticoagulation with rivaroxaban.  Discharge Diagnoses:  Principal Problem:   Acute metabolic encephalopathy Active Problems:   Hypertension   Atrial fibrillation (HCC)   CVA (cerebral vascular accident) ([place])   Acute-on-chronic kidney injury ([facility])   ARF (acute renal failure) ([place])   Aortic valve stenosis   Anemia due to chronic kidney disease   Folate deficiency   Dyslipidemia   Dementia ([place])    Discharge Instructions   Allergies as of 12/23/2019      Reactions   Aspirin Palpitations   PVC's   Novocain [procaine] Palpitations   Penicillins Other (See Comments)   Localized mass, 'size of grapefruit' PATIENT HAS HAD A PCN REACTION WITH IMMEDIATE RASH, FACIAL/TONGUE/THROAT SWELLING, SOB, OR LIGHTHEADEDNESS WITH HYPOTENSION:  #  #  YES  #  #  Has patient had a PCN reaction causing severe rash involving mucus membranes or skin necrosis: No Has patient had a PCN reaction that required hospitalization: No Has patient had a PCN reaction occurring within the last 10 years: No   Meloxicam    UNSPECIFIED REACTION    Codeine Other (See Comments)   hallucinations      Medication List    STOP taking these medications   furosemide 20 MG tablet Commonly known as: LASIX   sulfamethoxazole-trimethoprim 800-160 MG tablet Commonly known as: BACTRIM DS     TAKE these medications   acetaminophen 500 MG tablet Commonly known as: TYLENOL Take 500 mg by mouth in the morning, at noon, and at bedtime.   amiodarone 200 MG tablet Commonly known as: Pacerone Take 1 tablet (200 mg total) by mouth daily.   atorvastatin 10 MG  tablet Commonly known as: LIPITOR Take 10 mg by mouth daily.   barrier cream Crea Commonly known as: non-specified Apply 1 application topically in the morning, at noon, and at bedtime. Each shift   chlorhexidine 0.12 % solution Commonly known as: PERIDEX Use as directed 15 mLs in the mouth or throat 2 (two) times daily.   donepezil 5 MG tablet Commonly known as: ARICEPT Take 5 mg by mouth at bedtime.   Ensure Take 237 mLs by mouth 2 (two) times daily between meals.   folic acid 1 MG tablet Commonly known as: FOLVITE Take 1 tablet (1 mg total) by mouth daily.   metoprolol succinate 25 MG 24 hr tablet Commonly known as: TOPROL-XL Take 25 mg by mouth daily.   pantoprazole 40 MG tablet Commonly known as: PROTONIX Take 40 mg by mouth daily.   Rivaroxaban 15 MG Tabs tablet Commonly known as: XARELTO Take 1 tablet (15 mg total) by mouth daily with supper. What changed: when to take this       Contact information for after-discharge care    Destination    [facility] .   Service: Skilled Nursing Contact information: [place] [phone]                 Allergies  Allergen Reactions  . Aspirin Palpitations    PVC's  . Novocain [Procaine] Palpitations  . Penicillins Other (See Comments)    Localized mass, 'size of grapefruit' PATIENT HAS HAD A PCN  REACTION WITH IMMEDIATE RASH, FACIAL/TONGUE/THROAT SWELLING, SOB, OR LIGHTHEADEDNESS WITH HYPOTENSION:  #  #  YES  #  #  Has patient had a PCN reaction causing severe rash involving mucus membranes or skin necrosis: No Has patient had a PCN reaction that required hospitalization: No Has patient had a PCN reaction occurring within the last 10 years: No  . Meloxicam     UNSPECIFIED REACTION   . Codeine Other (See Comments)    hallucinations        Procedures/Studies: CT HEAD WO CONTRAST  Result Date: 12/20/2019 CLINICAL DATA:   Provided history: Mental status change, unknown cause. EXAM: CT HEAD WITHOUT CONTRAST TECHNIQUE: Contiguous axial images were obtained from the base of the skull through the vertex without intravenous contrast. COMPARISON:  MRI brain 04/21/2019, MRA head 04/22/2019, report from head CT 08/11/2002 (images unavailable). FINDINGS: Brain: Stable, mild generalized parenchymal atrophy. Known small chronic cortically based infarcts within the left frontal lobe were better appreciated on the prior MRI of 04/21/2019 (acute at that time). A known small chronic cortically based right parietal lobe infarct was also better appreciated on this prior study. Stable, mild ill-defined hypoattenuation within the cerebral white matter which is nonspecific, but consistent with chronic small vessel ischemic disease. Redemonstrated small chronic infarct within the right cerebellar hemisphere. There is no acute intracranial hemorrhage. No acute demarcated cortical infarct is identified. No extra-axial fluid collection. No evidence of intracranial mass. No midline shift. Vascular: No hyperdense vessel.  Atherosclerotic calcifications Skull: No calvarial fracture. Redemonstrated small osteoma projecting externally from the frontal calvarium. Sinuses/Orbits: Visualized orbits show no acute finding. Mild mucosal thickening and frothy secretions within the left sphenoid sinus. No significant mastoid effusion. IMPRESSION: No CT evidence of acute intracranial abnormality. Stable generalized parenchymal atrophy and chronic small vessel ischemic disease with multiple chronic infarcts as detailed. Left sphenoid sinusitis. Electronically Signed   By: Kellie Simmering DO   On: 12/20/2019 15:47       Subjective: Patient is back to her baseline, confirmed by her son at the bedside, no nausea or vomiting, tolerating po well, no further hallucinations.   Discharge Exam: Vitals:   12/22/19 2022 12/23/19 0458  BP: (!) 131/46 (!) 141/55  Pulse: 84  69  Resp: 18 16  Temp: 97.9 F (36.6 C) 98.4 F (36.9 C)  SpO2: 99% 98%   Vitals:   12/22/19 0506 12/22/19 1457 12/22/19 2022 12/23/19 0458  BP: (!) 108/46 112/61 (!) 131/46 (!) 141/55  Pulse: 60 60 84 69  Resp: 16 20 18 16   Temp: 97.7 F (36.5 C) 97.9 F (36.6 C) 97.9 F (36.6 C) 98.4 F (36.9 C)  TempSrc: Oral Oral Oral Oral  SpO2: 99% 100% 99% 98%  Weight:      Height:        General: Not in pain or dyspnea.  Neurology: Awake and alert, non focal  E ENT: mild pallor, no icterus, oral mucosa moist Cardiovascular: No JVD. S1-S2 present, rhythmic, no gallops, rubs, or murmurs. Trace non pitting lower extremity edema. Pulmonary: positive breath sounds bilaterally, adequate air movement, no wheezing, rhonchi or rales. Gastrointestinal. Abdomen protuberant, soft and non tender Skin. No rashes Musculoskeletal: no joint deformities   The results of significant diagnostics from this hospitalization (including imaging, microbiology, ancillary and laboratory) are listed below for reference.     Microbiology: Recent Results (from the past 240 hour(s))  SARS Coronavirus 2 by RT PCR (hospital order, performed in [facility] hospital lab) Nasopharyngeal Nasopharyngeal Swab  Status: None   Collection Time: 12/19/19  4:18 PM   Specimen: Nasopharyngeal Swab  Result Value Ref Range Status   SARS Coronavirus 2 NEGATIVE NEGATIVE Final    Comment: (NOTE) SARS-CoV-2 target nucleic acids are NOT DETECTED.  The SARS-CoV-2 RNA is generally detectable in upper and lower respiratory specimens during the acute phase of infection. The lowest concentration of SARS-CoV-2 viral copies this assay can detect is 250 copies / mL. A negative result does not preclude SARS-CoV-2 infection and should not be used as the sole basis for treatment or other patient management decisions.  A negative result may occur with improper specimen collection / handling, submission of specimen other than  nasopharyngeal swab, presence of viral mutation(s) within the areas targeted by this assay, and inadequate number of viral copies (<250 copies / mL). A negative result must be combined with clinical observations, patient history, and epidemiological information.  Fact Sheet for Patients:   StrictlyIdeas.no  Fact Sheet for Healthcare Providers: BankingDealers.co.za  This test is not yet approved or  cleared by the [place] FDA and has been authorized for detection and/or diagnosis of SARS-CoV-2 by FDA under an Emergency Use Authorization (EUA).  This EUA will remain in effect (meaning this test can be used) for the duration of the COVID-19 declaration under Section 564(b)(1) of the Act, 21 U.S.C. section 360bbb-3(b)(1), unless the authorization is terminated or revoked sooner.  Performed at [facility], [place] [address], [place], [place] [address]   Urine culture     Status: Abnormal   Collection Time: 12/19/19  7:50 PM   Specimen: Urine, Random  Result Value Ref Range Status   Specimen Description URINE, RANDOM  Final   Special Requests   Final    NONE Performed at [facility], [place] [address], [place], [place] [address]    Culture (A)  Final    >=100,000 COLONIES/mL KLEBSIELLA PNEUMONIAE Confirmed Extended Spectrum Beta-Lactamase Producer (ESBL).  In bloodstream infections from ESBL organisms, carbapenems are preferred over piperacillin/tazobactam. They are shown to have a lower risk of mortality.    Report Status 12/21/2019 FINAL  Final   Organism ID, Bacteria KLEBSIELLA PNEUMONIAE (A)  Final      Susceptibility   Klebsiella pneumoniae - MIC*    AMPICILLIN >=32 RESISTANT Resistant     CEFAZOLIN >=64 RESISTANT Resistant     CEFTRIAXONE >=64 RESISTANT Resistant     CIPROFLOXACIN >=4 RESISTANT Resistant     GENTAMICIN >=16 RESISTANT Resistant     IMIPENEM 0.5 SENSITIVE Sensitive     NITROFURANTOIN 128 RESISTANT  Resistant     TRIMETH/SULFA >=320 RESISTANT Resistant     AMPICILLIN/SULBACTAM >=32 RESISTANT Resistant     PIP/TAZO 64 INTERMEDIATE Intermediate     * >=100,000 COLONIES/mL KLEBSIELLA PNEUMONIAE  MRSA PCR Screening     Status: None   Collection Time: 12/20/19 12:52 AM   Specimen: Nasal Mucosa; Nasopharyngeal  Result Value Ref Range Status   MRSA by PCR NEGATIVE NEGATIVE Final    Comment:        The GeneXpert MRSA Assay (FDA approved for NASAL specimens only), is one component of a comprehensive MRSA colonization surveillance program. It is not intended to diagnose MRSA infection nor to guide or monitor treatment for MRSA infections. Performed at [facility], [place] [address], [place], [place] [address]   Gastrointestinal Panel by PCR , Stool     Status: None   Collection Time: 12/20/19  1:08 AM  Specimen: Stool  Result Value Ref Range Status   Campylobacter species NOT DETECTED NOT DETECTED Final   Plesimonas shigelloides NOT DETECTED NOT DETECTED Final   Salmonella species NOT DETECTED NOT DETECTED Final   Yersinia enterocolitica NOT DETECTED NOT DETECTED Final   Vibrio species NOT DETECTED NOT DETECTED Final   Vibrio cholerae NOT DETECTED NOT DETECTED Final   Enteroaggregative E coli (EAEC) NOT DETECTED NOT DETECTED Final   Enteropathogenic E coli (EPEC) NOT DETECTED NOT DETECTED Final   Enterotoxigenic E coli (ETEC) NOT DETECTED NOT DETECTED Final   Shiga like toxin producing E coli (STEC) NOT DETECTED NOT DETECTED Final   Shigella/Enteroinvasive E coli (EIEC) NOT DETECTED NOT DETECTED Final   Cryptosporidium NOT DETECTED NOT DETECTED Final   Cyclospora cayetanensis NOT DETECTED NOT DETECTED Final   Entamoeba histolytica NOT DETECTED NOT DETECTED Final   Giardia lamblia NOT DETECTED NOT DETECTED Final   Adenovirus F40/41 NOT DETECTED NOT DETECTED Final   Astrovirus NOT DETECTED NOT DETECTED Final   Norovirus GI/GII NOT DETECTED NOT DETECTED Final   Rotavirus A  NOT DETECTED NOT DETECTED Final   Sapovirus (I, II, IV, and V) NOT DETECTED NOT DETECTED Final    Comment: Performed at [facility], [address] [place], [place] [address]  C Difficile Quick Screen w PCR reflex     Status: None   Collection Time: 12/20/19  1:08 AM   Specimen: Stool  Result Value Ref Range Status   C Diff antigen NEGATIVE NEGATIVE Final   C Diff toxin NEGATIVE NEGATIVE Final   C Diff interpretation No C. difficile detected.  Final    Comment: Performed at [facility], [place] [address], [place], [place] [address]  SARS Coronavirus 2 by RT PCR (hospital order, performed in [facility] hospital lab) Nasopharyngeal Nasopharyngeal Swab     Status: None   Collection Time: 12/22/19  5:15 PM   Specimen: Nasopharyngeal Swab  Result Value Ref Range Status   SARS Coronavirus 2 NEGATIVE NEGATIVE Final    Comment: (NOTE) SARS-CoV-2 target nucleic acids are NOT DETECTED.  The SARS-CoV-2 RNA is generally detectable in upper and lower respiratory specimens during the acute phase of infection. The lowest concentration of SARS-CoV-2 viral copies this assay can detect is 250 copies / mL. A negative result does not preclude SARS-CoV-2 infection and should not be used as the sole basis for treatment or other patient management decisions.  A negative result may occur with improper specimen collection / handling, submission of specimen other than nasopharyngeal swab, presence of viral mutation(s) within the areas targeted by this assay, and inadequate number of viral copies (<250 copies / mL). A negative result must be combined with clinical observations, patient history, and epidemiological information.  Fact Sheet for Patients:   StrictlyIdeas.no  Fact Sheet for Healthcare Providers: BankingDealers.co.za  This test is not yet approved or  cleared by the [place] FDA and has been authorized for detection and/or  diagnosis of SARS-CoV-2 by FDA under an Emergency Use Authorization (EUA).  This EUA will remain in effect (meaning this test can be used) for the duration of the COVID-19 declaration under Section 564(b)(1) of the Act, 21 U.S.C. section 360bbb-3(b)(1), unless the authorization is terminated or revoked sooner.  Performed at [facility], [place] [address] [place], [place] [address]      Labs: BNP (last 3 results) No results for input(s): BNP in the last 8760 hours. Basic Metabolic Panel: Recent Labs  Lab  12/19/19 1122 12/20/19 0335 12/21/19 0700 12/22/19 0207 12/23/19 0247  NA 136 136 138 135 132*  K 5.0 5.1 4.4 4.4 4.4  CL 103 106 108 109 107  CO2 23 18* 20* 21* 19*  GLUCOSE 88 61* 91 130* 102*  BUN 31* 32* 29* 28* 23  CREATININE 3.29* 3.36* 2.47* 2.32* 2.07*  CALCIUM 8.1* 7.7* 8.1* 7.8* 7.8*   Liver Function Tests: Recent Labs  Lab 12/19/19 1122  AST 71*  ALT 38  ALKPHOS 88  BILITOT 1.0  PROT 6.4*  ALBUMIN 3.2*   No results for input(s): LIPASE, AMYLASE in the last 168 hours. No results for input(s): AMMONIA in the last 168 hours. CBC: Recent Labs  Lab 12/19/19 1122 12/20/19 0335 12/21/19 0700 12/22/19 0207 12/23/19 0247  WBC 4.9 4.6 4.4 4.1 3.6*  HGB 10.8* 9.8* 10.4* 9.3* 9.0*  HCT 34.2* 31.1* 32.1* 28.7* 28.2*  MCV 100.9* 100.3* 101.6* 101.7* 100.4*  PLT 255 205 211 197 138*   Cardiac Enzymes: No results for input(s): CKTOTAL, CKMB, CKMBINDEX, TROPONINI in the last 168 hours. BNP: Invalid input(s): POCBNP CBG: Recent Labs  Lab 12/21/19 2108 12/22/19 0735 12/22/19 1205 12/22/19 1623 12/23/19 0732  GLUCAP 133* 109* 106* 110* 81   D-Dimer No results for input(s): DDIMER in the last 72 hours. Hgb A1c No results for input(s): HGBA1C in the last 72 hours. Lipid Profile No results for input(s): CHOL, HDL, LDLCALC, TRIG, CHOLHDL, LDLDIRECT in the last 72 hours. Thyroid function studies No results for input(s): TSH, T4TOTAL, T3FREE,  THYROIDAB in the last 72 hours.  Invalid input(s): FREET3 Anemia work up Recent Labs    12/21/19 0700  VITAMINB12 421  FOLATE 2.0*  FERRITIN 230  TIBC 141*  IRON 40  RETICCTPCT 1.6   Urinalysis    Component Value Date/Time   COLORURINE YELLOW 12/19/2019 1950   APPEARANCEUR CLEAR 12/19/2019 1950   LABSPEC 1.014 12/19/2019 1950   PHURINE 5.0 12/19/2019 1950   GLUCOSEU NEGATIVE 12/19/2019 1950   HGBUR NEGATIVE 12/19/2019 1950   BILIRUBINUR NEGATIVE 12/19/2019 1950   KETONESUR NEGATIVE 12/19/2019 1950   PROTEINUR NEGATIVE 12/19/2019 1950   UROBILINOGEN 0.2 04/26/2008 0116   NITRITE NEGATIVE 12/19/2019 1950   LEUKOCYTESUR SMALL (A) 12/19/2019 1950   Sepsis Labs Invalid input(s): PROCALCITONIN,  WBC,  LACTICIDVEN Microbiology Recent Results (from the past 240 hour(s))  SARS Coronavirus 2 by RT PCR (hospital order, performed in [facility] hospital lab) Nasopharyngeal Nasopharyngeal Swab     Status: None   Collection Time: 12/19/19  4:18 PM   Specimen: Nasopharyngeal Swab  Result Value Ref Range Status   SARS Coronavirus 2 NEGATIVE NEGATIVE Final    Comment: (NOTE) SARS-CoV-2 target nucleic acids are NOT DETECTED.  The SARS-CoV-2 RNA is generally detectable in upper and lower respiratory specimens during the acute phase of infection. The lowest concentration of SARS-CoV-2 viral copies this assay can detect is 250 copies / mL. A negative result does not preclude SARS-CoV-2 infection and should not be used as the sole basis for treatment or other patient management decisions.  A negative result may occur with improper specimen collection / handling, submission of specimen other than nasopharyngeal swab, presence of viral mutation(s) within the areas targeted by this assay, and inadequate number of viral copies (<250 copies / mL). A negative result must be combined with clinical observations, patient history, and epidemiological information.  Fact Sheet for Patients:    StrictlyIdeas.no  Fact Sheet for Healthcare Providers: BankingDealers.co.za  This test is not  yet approved or  cleared by the [place] and has been authorized for detection and/or diagnosis of SARS-CoV-2 by FDA under an Emergency Use Authorization (EUA).  This EUA will remain in effect (meaning this test can be used) for the duration of the COVID-19 declaration under Section 564(b)(1) of the Act, 21 U.S.C. section 360bbb-3(b)(1), unless the authorization is terminated or revoked sooner.  Performed at [facility], [place] [address], [place], [place] [address]   Urine culture     Status: Abnormal   Collection Time: 12/19/19  7:50 PM   Specimen: Urine, Random  Result Value Ref Range Status   Specimen Description URINE, RANDOM  Final   Special Requests   Final    NONE Performed at [facility], [place] [address] [place], [place] [address]    Culture (A)  Final    >=100,000 COLONIES/mL KLEBSIELLA PNEUMONIAE Confirmed Extended Spectrum Beta-Lactamase Producer (ESBL).  In bloodstream infections from ESBL organisms, carbapenems are preferred over piperacillin/tazobactam. They are shown to have a lower risk of mortality.    Report Status 12/21/2019 FINAL  Final   Organism ID, Bacteria KLEBSIELLA PNEUMONIAE (A)  Final      Susceptibility   Klebsiella pneumoniae - MIC*    AMPICILLIN >=32 RESISTANT Resistant     CEFAZOLIN >=64 RESISTANT Resistant     CEFTRIAXONE >=64 RESISTANT Resistant     CIPROFLOXACIN >=4 RESISTANT Resistant     GENTAMICIN >=16 RESISTANT Resistant     IMIPENEM 0.5 SENSITIVE Sensitive     NITROFURANTOIN 128 RESISTANT Resistant     TRIMETH/SULFA >=320 RESISTANT Resistant     AMPICILLIN/SULBACTAM >=32 RESISTANT Resistant     PIP/TAZO 64 INTERMEDIATE Intermediate     * >=100,000 COLONIES/mL KLEBSIELLA PNEUMONIAE  MRSA PCR Screening     Status: None   Collection Time: 12/20/19 12:52 AM   Specimen: Nasal  Mucosa; Nasopharyngeal  Result Value Ref Range Status   MRSA by PCR NEGATIVE NEGATIVE Final    Comment:        The GeneXpert MRSA Assay (FDA approved for NASAL specimens only), is one component of a comprehensive MRSA colonization surveillance program. It is not intended to diagnose MRSA infection nor to guide or monitor treatment for MRSA infections. Performed at [facility], [place] [address] [place], [place] [address]   Gastrointestinal Panel by PCR , Stool     Status: None   Collection Time: 12/20/19  1:08 AM   Specimen: Stool  Result Value Ref Range Status   Campylobacter species NOT DETECTED NOT DETECTED Final   Plesimonas shigelloides NOT DETECTED NOT DETECTED Final   Salmonella species NOT DETECTED NOT DETECTED Final   Yersinia enterocolitica NOT DETECTED NOT DETECTED Final   Vibrio species NOT DETECTED NOT DETECTED Final   Vibrio cholerae NOT DETECTED NOT DETECTED Final   Enteroaggregative E coli (EAEC) NOT DETECTED NOT DETECTED Final   Enteropathogenic E coli (EPEC) NOT DETECTED NOT DETECTED Final   Enterotoxigenic E coli (ETEC) NOT DETECTED NOT DETECTED Final   Shiga like toxin producing E coli (STEC) NOT DETECTED NOT DETECTED Final   Shigella/Enteroinvasive E coli (EIEC) NOT DETECTED NOT DETECTED Final   Cryptosporidium NOT DETECTED NOT DETECTED Final   Cyclospora cayetanensis NOT DETECTED NOT DETECTED Final   Entamoeba histolytica NOT DETECTED NOT DETECTED Final   Giardia lamblia NOT DETECTED NOT DETECTED Final   Adenovirus F40/41 NOT DETECTED NOT DETECTED Final   Astrovirus NOT DETECTED NOT DETECTED Final   Norovirus  GI/GII NOT DETECTED NOT DETECTED Final   Rotavirus A NOT DETECTED NOT DETECTED Final   Sapovirus (I, II, IV, and V) NOT DETECTED NOT DETECTED Final    Comment: Performed at [facility], [place], [place] [address]  C Difficile Quick Screen w PCR reflex     Status: None   Collection Time: 12/20/19  1:08 AM   Specimen:  Stool  Result Value Ref Range Status   C Diff antigen NEGATIVE NEGATIVE Final   C Diff toxin NEGATIVE NEGATIVE Final   C Diff interpretation No C. difficile detected.  Final    Comment: Performed at [facility], [place] [address], [place], [place] [address]  SARS Coronavirus 2 by RT PCR (hospital order, performed in [facility] hospital lab) Nasopharyngeal Nasopharyngeal Swab     Status: None   Collection Time: 12/22/19  5:15 PM   Specimen: Nasopharyngeal Swab  Result Value Ref Range Status   SARS Coronavirus 2 NEGATIVE NEGATIVE Final    Comment: (NOTE) SARS-CoV-2 target nucleic acids are NOT DETECTED.  The SARS-CoV-2 RNA is generally detectable in upper and lower respiratory specimens during the acute phase of infection. The lowest concentration of SARS-CoV-2 viral copies this assay can detect is 250 copies / mL. A negative result does not preclude SARS-CoV-2 infection and should not be used as the sole basis for treatment or other patient management decisions.  A negative result may occur with improper specimen collection / handling, submission of specimen other than nasopharyngeal swab, presence of viral mutation(s) within the areas targeted by this assay, and inadequate number of viral copies (<250 copies / mL). A negative result must be combined with clinical observations, patient history, and epidemiological information.  Fact Sheet for Patients:   StrictlyIdeas.no  Fact Sheet for Healthcare Providers: BankingDealers.co.za  This test is not yet approved or  cleared by the [place] FDA and has been authorized for detection and/or diagnosis of SARS-CoV-2 by FDA under an Emergency Use Authorization (EUA).  This EUA will remain in effect (meaning this test can be used) for the duration of the COVID-19 declaration under Section 564(b)(1) of the Act, 21 U.S.C. section 360bbb-3(b)(1), unless the authorization is terminated  or revoked sooner.  Performed at [facility], [place] [address] [place], [place] [address]      Time coordinating discharge: 45 minutes  SIGNED:   Tawni Millers, MD  [facility] 12/23/2019, 9:59 AM

## 2019-12-23 NOTE — Social Work (Signed)
Clinical Social Worker facilitated patient discharge including contacting patient family and facility to confirm patient discharge plans.  Clinical information faxed to facility and family agreeable with plan.  CSW arranged ambulance transport via [facility] to [place] to call [phone]  with report prior to discharge.  Clinical Social Worker will sign off for now as social work intervention is no longer needed. Please consult [place] again if new need arises.  Westley Hummer, MSW, LCSW Clinical Social Worker

## 2019-12-23 NOTE — Progress Notes (Signed)
Message left for call back to give report.

## 2019-12-28 ENCOUNTER — Other Ambulatory Visit: Payer: Self-pay

## 2019-12-28 ENCOUNTER — Non-Acute Institutional Stay: Payer: Self-pay | Admitting: Nurse Practitioner

## 2019-12-28 NOTE — Progress Notes (Deleted)
[place] Consult Note Telephone: [phone]  Fax: [phone]  PATIENT NAME: Christine Conley [id number] [phone] (home)  DOB: Dec 14, 1928 MRN: [id number]  PRIMARY CARE PROVIDER:    [name], MD,  [place] [address] [phone]  REFERRING PROVIDER:   [name], [place] [place],  [place] [address] [phone]  RESPONSIBLE PARTY:   Extended Emergency Contact Information Primary Emergency Contact: [facility] Address: [place] [address] [address] of [place] Phone: [phone] Relation: Son Secondary Emergency Contact: DEETTA, SIEGMANN Mobile Phone: [phone] Relation: Son  I met face to face with patient and family in home/facility.  ASSESSMENT AND RECOMMENDATIONS:   1. Advance Care Planning/Goals of Care: Goals include to maximize quality of life and symptom management. Our advance care planning conversation included a discussion about:     The value and importance of advance care planning   Experiences with loved ones who have been seriously ill or have died   Exploration of personal, cultural or spiritual beliefs that might influence medical decisions   Exploration of goals of care in the event of a sudden injury or illness   Identification and preparation of a healthcare agent   Review and updating or creation of an  advance directive document . Visit at the request of *** for palliative consult. Visit consisted of building trust and discussions on Palliative Medicine as specialized medical care for people living with serious illness, aimed at facilitating better quality of life through symptoms relief, assisting with advance care plan and establishing goals of care. Spouse and grand daughter during present at visit.  This is a joint visit of this NP and *** with patient and family.  Family  expressed appreciation for education provided on Palliative care and how that differs from Hospice service. Palliative care will continue to provide support to patient, family and the medical team.   Code Status: After discussions on ramifications and implications of CODE STATUS patient elected to be a full Code. Patient reiterated desire for full spectrum of care including mechanical ventilation.  Validation provided.  It was also explained to patient that [place] will be regularly reviewed to ensure that it reflects patient wishes.  Patient and family open to further discussion on CODE STATUS and possible changes in the future.  2. Symptom Management:   3. Follow up Palliative Care Visit: Palliative care will continue to follow for goals of care clarification and symptom management. Return *** weeks or prn.  4. Family /Caregiver/Community Supports:   5. Cognitive / Functional decline:   I spent *** minutes providing this consultation, time includes time spent with patient/family, chart review, provider coordination, and documentation. More than 50% of the time in this consultation was spent coordinating communication.   CHIEF COMPLAINT:  HISTORY OF PRESENT ILLNESS:  Christine Conley is a 86 y.o. year old female with multiple medical problems including ***. Palliative Care was asked to follow this patient by consultation request of [name], MD to help address advance care planning and goals of care. Palliative Care was asked to help address goals of care. This is a *** visit.  CODE STATUS:   PPS: ***0%  HOSPICE ELIGIBILITY/DIAGNOSIS: TBD  PAST MEDICAL HISTORY:  Past Medical History:  Diagnosis Date  . Acid reflux   . Anxiety   . Atrial fibrillation ([place])   .  Bilateral ovarian cysts   . Hypertension   . Memory loss    patient has been tested for dementia but was not diagnosed    SOCIAL HX:  Social History   Tobacco Use  . Smoking status: Former Research scientist (life sciences)  . Smokeless  tobacco: Never Used  Substance Use Topics  . Alcohol use: No   FAMILY HX:  Family History  Problem Relation Age of Onset  . Other Mother        died at 37 - old age  . Heart disease Father        died at 63    ALLERGIES:  Allergies  Allergen Reactions  . Aspirin Palpitations    PVC's  . Novocain [Procaine] Palpitations  . Penicillins Other (See Comments)    Localized mass, 'size of grapefruit' PATIENT HAS HAD A PCN REACTION WITH IMMEDIATE RASH, FACIAL/TONGUE/THROAT SWELLING, SOB, OR LIGHTHEADEDNESS WITH HYPOTENSION:  #  #  YES  #  #  Has patient had a PCN reaction causing severe rash involving mucus membranes or skin necrosis: No Has patient had a PCN reaction that required hospitalization: No Has patient had a PCN reaction occurring within the last 10 years: No  . Meloxicam     UNSPECIFIED REACTION   . Codeine Other (See Comments)    hallucinations     PERTINENT MEDICATIONS:  Outpatient Encounter Medications as of 84/12/2019  Medication Sig  . acetaminophen (TYLENOL) 500 MG tablet Take 500 mg by mouth in the morning, at noon, and at bedtime.  Marland Kitchen amiodarone (PACERONE) 200 MG tablet Take 1 tablet (200 mg total) by mouth daily.  Marland Kitchen atorvastatin (LIPITOR) 10 MG tablet Take 10 mg by mouth daily.  . barrier cream (NON-SPECIFIED) CREA Apply 1 application topically in the morning, at noon, and at bedtime. Each shift  . chlorhexidine (PERIDEX) 0.12 % solution Use as directed 15 mLs in the mouth or throat 2 (two) times daily.  Marland Kitchen donepezil (ARICEPT) 5 MG tablet Take 5 mg by mouth at bedtime.  . Ensure (ENSURE) Take 237 mLs by mouth 2 (two) times daily between meals.  . folic acid (FOLVITE) 1 MG tablet Take 1 tablet (1 mg total) by mouth daily.  . metoprolol succinate (TOPROL-XL) 25 MG 24 hr tablet Take 25 mg by mouth daily.  . pantoprazole (PROTONIX) 40 MG tablet Take 40 mg by mouth daily.   . Rivaroxaban (XARELTO) 15 MG TABS tablet Take 1 tablet (15 mg total) by mouth daily with supper.  (Patient taking differently: Take 15 mg by mouth daily. )   No facility-administered encounter medications on file as of 12/28/2019.    PHYSICAL EXAM / ROS:   Current and past weights: *** General: NAD, frail appearing, thin Cardiovascular: no chest pain reported, no edema  Pulmonary: no cough, no increased SOB, room air Abdomen: appetite fair, endorses constipation, incontinent of bowel GU: denies dysuria, incontinent of urine MSK:  no joint and ROM abnormalities, ambulatory Skin: no rashes or wounds reported Neurological: Weakness, but otherwise nonfocal  Alba Destine, NP , DNP, AGPCNP-BC

## 2021-01-04 IMAGING — CT CT HEAD W/O CM
3 series · 15 of 47 positions shown, 18 images · non-contrast
Comparison: MRI brain 04/21/2019, MRA head 04/22/2019, report from
head CT 08/11/2002 (images unavailable).

CLINICAL DATA: Provided history: Mental status change, unknown
cause.

EXAM:
CT HEAD WITHOUT CONTRAST
TECHNIQUE: Contiguous axial images were obtained from the base of the skull
through the vertex without intravenous contrast.

[Series 3: head 5.0 h30s · axial · 0.42mm/px · z∈[+107,+257]mm · 9 of 36 slices shown, 12 images]
[im 3/36  brain]
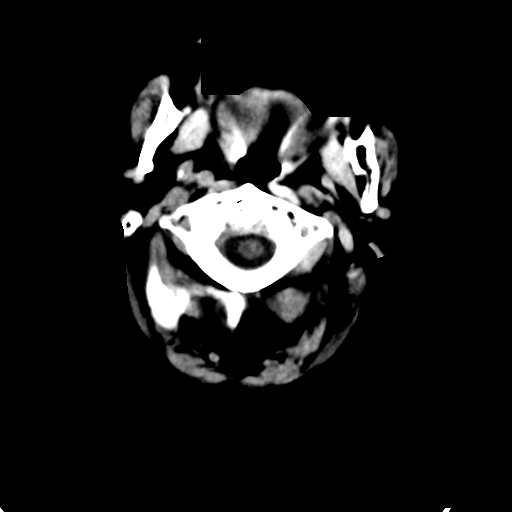
[im 3/36  bone]
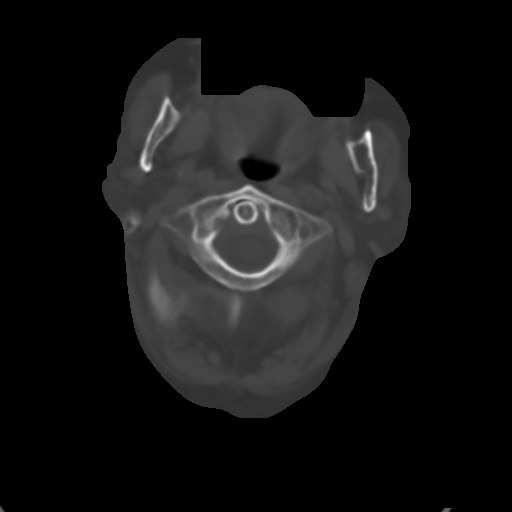
[im 7/36  brain]
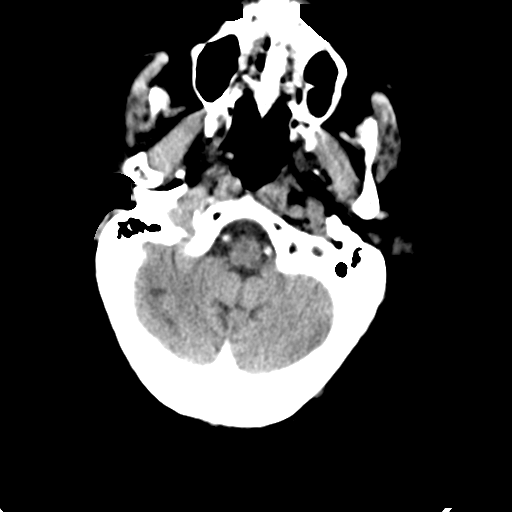
[im 10/36  brain]
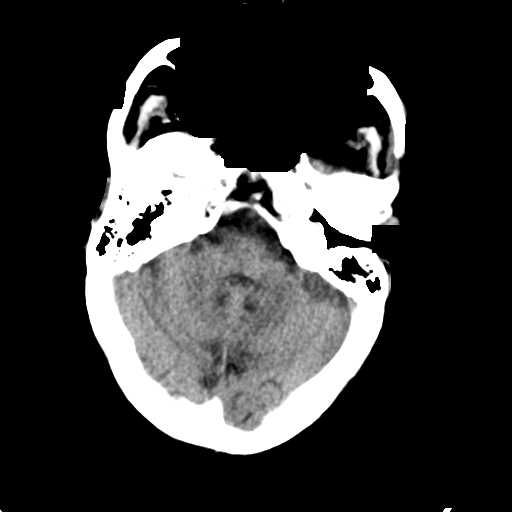
[im 14/36  brain]
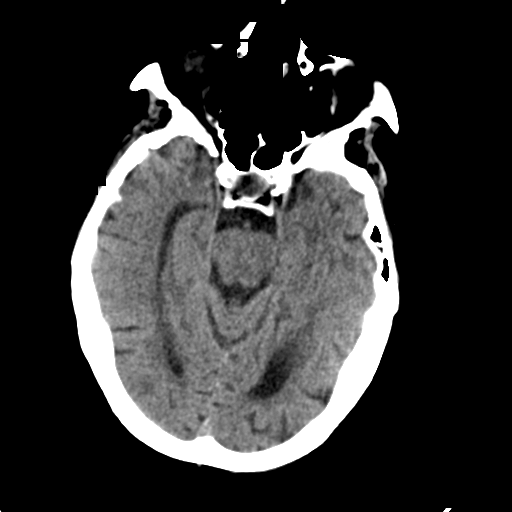
[im 19/36  brain]
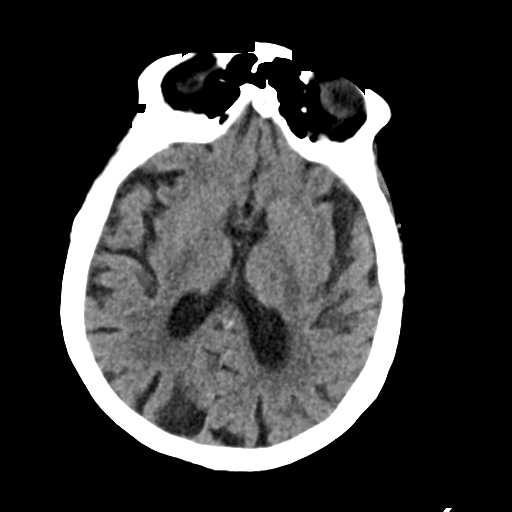
[im 19/36  bone]
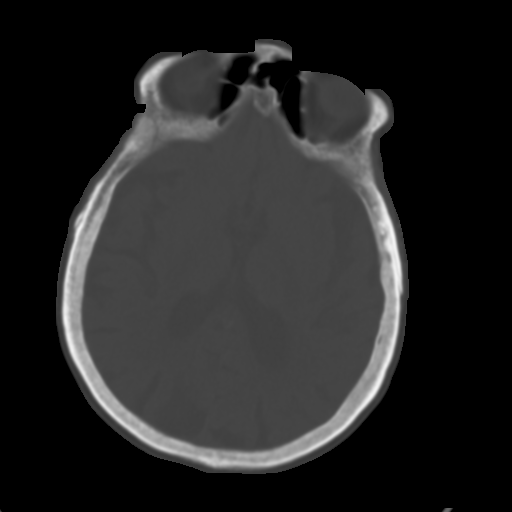
[im 22/36  brain]
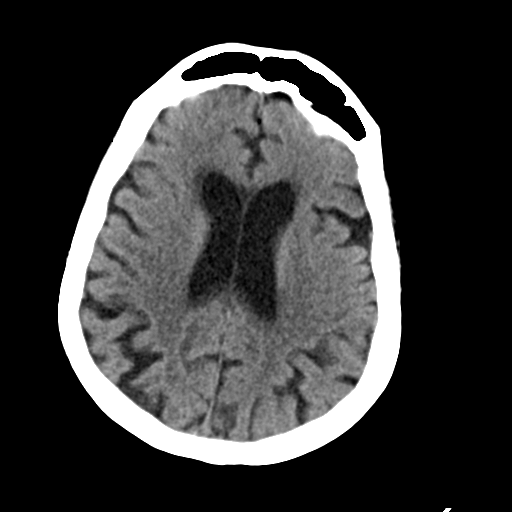
[im 26/36  brain]
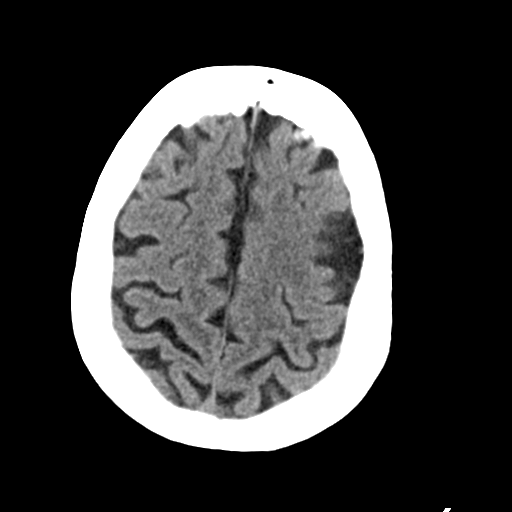
[im 29/36  brain]
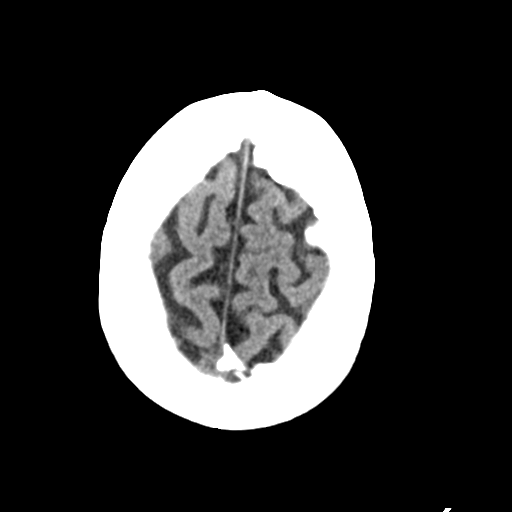
[im 33/36  brain]
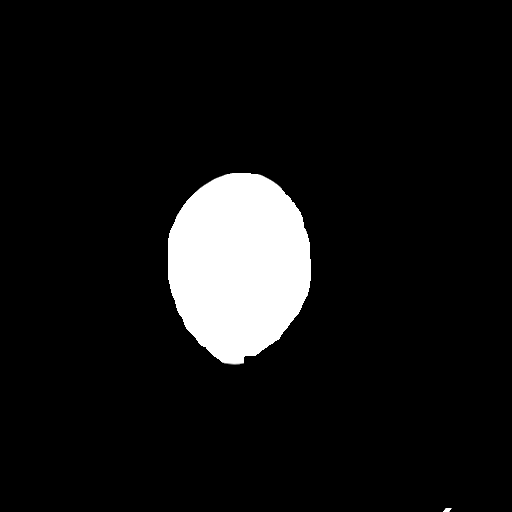
[im 33/36  bone]
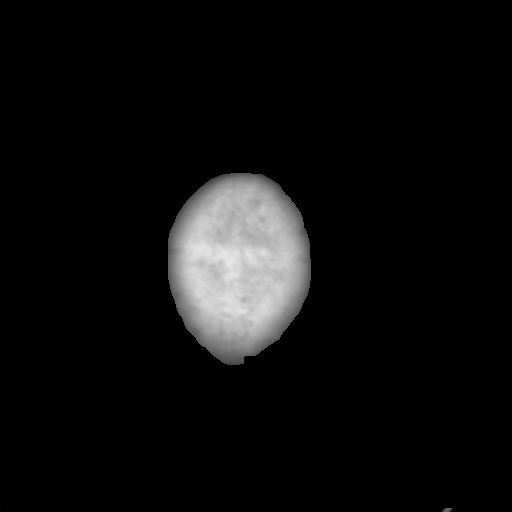

[Series 5: head 3.0 mpr cor · coronal · 0.35mm/px · 3 of 70 slices shown]
[im 24/70  brain]
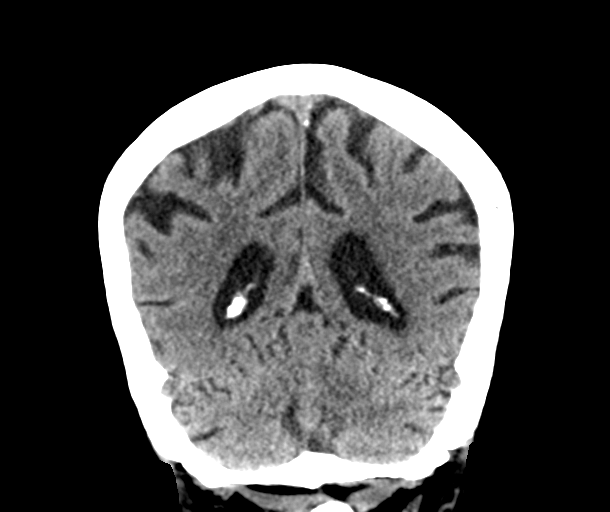
[im 31/70  brain]
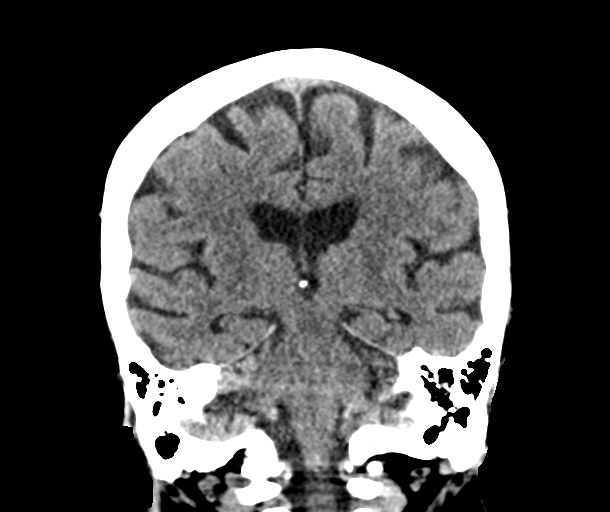
[im 39/70  brain]
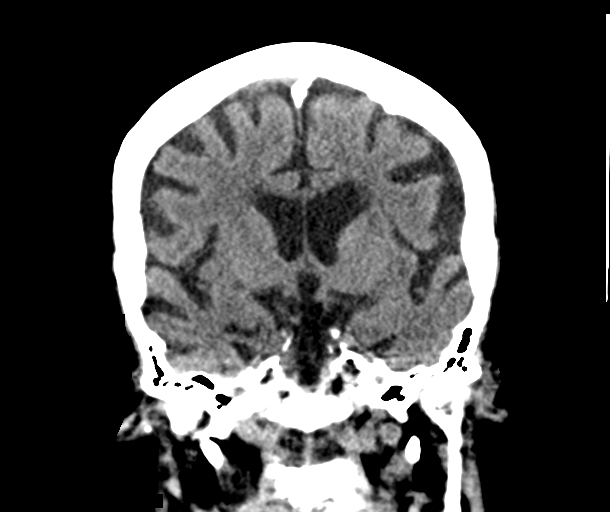

[Series 6: head 3.0 mpr sag · sagittal · 0.35mm/px · 3 of 64 slices shown]
[im 22/64  brain]
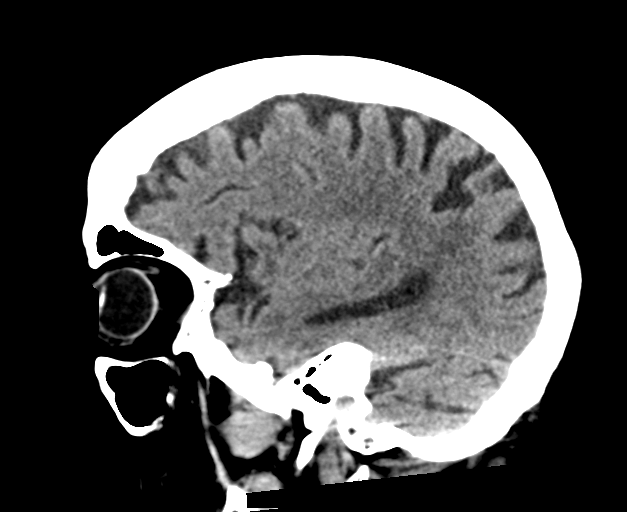
[im 32/64  brain]
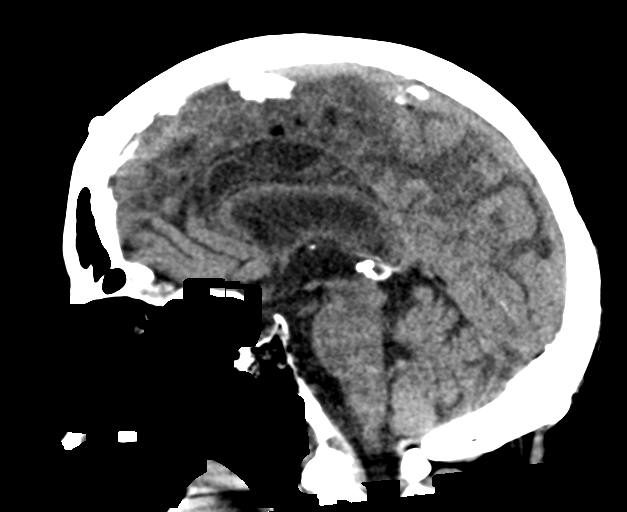
[im 43/64  brain]
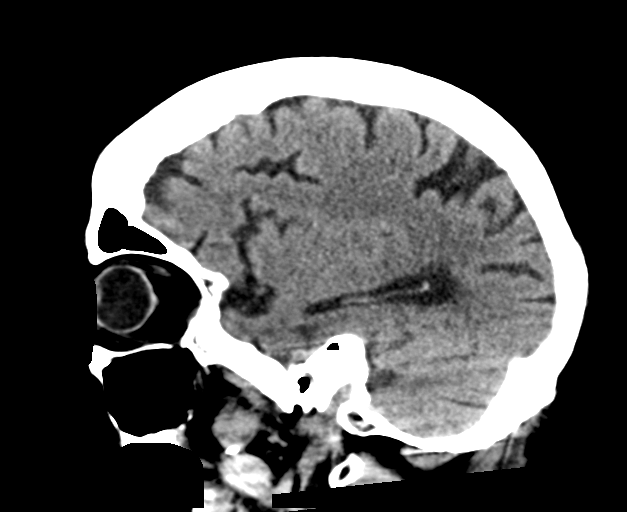

[15 of 47 positions shown; findings below may reference images not displayed]

FINDINGS: Brain:

Stable, mild generalized parenchymal atrophy.

Known small chronic cortically based infarcts within the left
frontal lobe were better appreciated on the prior MRI of 04/21/2019
(acute at that time). A known small chronic cortically based right
parietal lobe infarct was also better appreciated on this prior
study.

Stable, mild ill-defined hypoattenuation within the cerebral white
matter which is nonspecific, but consistent with chronic small
vessel ischemic disease.

Redemonstrated small chronic infarct within the right cerebellar
hemisphere.

There is no acute intracranial hemorrhage.

No acute demarcated cortical infarct is identified.

No extra-axial fluid collection.

No evidence of intracranial mass.

No midline shift.

Vascular: No hyperdense vessel.  Atherosclerotic calcifications

Skull: No calvarial fracture. Redemonstrated small osteoma
projecting externally from the frontal calvarium.

Sinuses/Orbits: Visualized orbits show no acute finding. Mild
mucosal thickening and frothy secretions within the left sphenoid
sinus. No significant mastoid effusion.
IMPRESSION: No CT evidence of acute intracranial abnormality.

Stable generalized parenchymal atrophy and chronic small vessel
ischemic disease with multiple chronic infarcts as detailed.

Left sphenoid sinusitis.

## 2021-02-18 DEATH — deceased
